# Patient Record
Sex: Male | Born: 1960 | Race: White | Hispanic: No | Marital: Married | Smoking: Current every day smoker
Health system: Southern US, Community
[De-identification: ages and names within clinical notes are randomized; demographics above are authoritative.]

## PROBLEM LIST (undated history)

## (undated) DIAGNOSIS — M542 Cervicalgia: Secondary | ICD-10-CM

## (undated) DIAGNOSIS — G8929 Other chronic pain: Secondary | ICD-10-CM

---

## 2003-07-24 ENCOUNTER — Ambulatory Visit (HOSPITAL_COMMUNITY): Admission: RE | Admit: 2003-07-24 | Discharge: 2003-07-24 | Payer: Self-pay | Admitting: Neurology

## 2003-12-19 ENCOUNTER — Ambulatory Visit (HOSPITAL_COMMUNITY): Admission: RE | Admit: 2003-12-19 | Discharge: 2003-12-19 | Payer: Self-pay | Admitting: Internal Medicine

## 2005-02-01 ENCOUNTER — Emergency Department (HOSPITAL_COMMUNITY): Admission: EM | Admit: 2005-02-01 | Discharge: 2005-02-01 | Payer: Self-pay | Admitting: Emergency Medicine

## 2005-02-11 ENCOUNTER — Emergency Department (HOSPITAL_COMMUNITY): Admission: EM | Admit: 2005-02-11 | Discharge: 2005-02-11 | Payer: Self-pay | Admitting: Emergency Medicine

## 2005-02-11 ENCOUNTER — Ambulatory Visit: Payer: Self-pay | Admitting: Psychiatry

## 2005-02-11 ENCOUNTER — Inpatient Hospital Stay (HOSPITAL_COMMUNITY): Admission: RE | Admit: 2005-02-11 | Discharge: 2005-02-15 | Payer: Self-pay | Admitting: Psychiatry

## 2005-03-14 ENCOUNTER — Ambulatory Visit: Payer: Self-pay | Admitting: Psychology

## 2005-04-29 ENCOUNTER — Ambulatory Visit: Payer: Self-pay | Admitting: Psychiatry

## 2005-05-03 ENCOUNTER — Emergency Department (HOSPITAL_COMMUNITY): Admission: EM | Admit: 2005-05-03 | Discharge: 2005-05-03 | Payer: Self-pay | Admitting: Emergency Medicine

## 2005-10-14 ENCOUNTER — Ambulatory Visit (HOSPITAL_COMMUNITY): Admission: RE | Admit: 2005-10-14 | Discharge: 2005-10-14 | Payer: Self-pay | Admitting: Family Medicine

## 2006-08-19 ENCOUNTER — Ambulatory Visit (HOSPITAL_COMMUNITY): Admission: RE | Admit: 2006-08-19 | Discharge: 2006-08-19 | Payer: Self-pay | Admitting: Internal Medicine

## 2010-08-07 ENCOUNTER — Emergency Department (HOSPITAL_COMMUNITY)
Admission: EM | Admit: 2010-08-07 | Discharge: 2010-08-08 | Disposition: A | Discharge status: Home / Self Care | Payer: Self-pay | Attending: Emergency Medicine | Admitting: Emergency Medicine

## 2010-08-07 DIAGNOSIS — K029 Dental caries, unspecified: Secondary | ICD-10-CM | POA: Insufficient documentation

## 2010-08-07 DIAGNOSIS — K089 Disorder of teeth and supporting structures, unspecified: Secondary | ICD-10-CM | POA: Insufficient documentation

## 2011-01-10 ENCOUNTER — Emergency Department (HOSPITAL_COMMUNITY)
Admission: EM | Admit: 2011-01-10 | Discharge: 2011-01-11 | Disposition: A | Discharge status: Home / Self Care | Payer: Medicaid Other | Attending: Emergency Medicine | Admitting: Emergency Medicine

## 2011-01-10 DIAGNOSIS — K089 Disorder of teeth and supporting structures, unspecified: Secondary | ICD-10-CM | POA: Insufficient documentation

## 2012-08-06 ENCOUNTER — Emergency Department (HOSPITAL_COMMUNITY)
Admission: EM | Admit: 2012-08-06 | Discharge: 2012-08-06 | Disposition: A | Discharge status: Home / Self Care | Payer: Self-pay | Attending: Emergency Medicine | Admitting: Emergency Medicine

## 2012-08-06 ENCOUNTER — Encounter (HOSPITAL_COMMUNITY): Payer: Self-pay

## 2012-08-06 ENCOUNTER — Emergency Department (HOSPITAL_COMMUNITY): Payer: Self-pay

## 2012-08-06 DIAGNOSIS — R071 Chest pain on breathing: Secondary | ICD-10-CM | POA: Insufficient documentation

## 2012-08-06 DIAGNOSIS — F17200 Nicotine dependence, unspecified, uncomplicated: Secondary | ICD-10-CM | POA: Insufficient documentation

## 2012-08-06 DIAGNOSIS — R079 Chest pain, unspecified: Secondary | ICD-10-CM

## 2012-08-06 DIAGNOSIS — R0602 Shortness of breath: Secondary | ICD-10-CM | POA: Insufficient documentation

## 2012-08-06 LAB — CBC WITH DIFFERENTIAL/PLATELET
Basophils Absolute: 0.1 10*3/uL (ref 0.0–0.1)
Basophils Relative: 1 % (ref 0–1)
Eosinophils Absolute: 0.2 10*3/uL (ref 0.0–0.7)
Eosinophils Relative: 3 % (ref 0–5)
HCT: 43.4 % (ref 39.0–52.0)
Hemoglobin: 15 g/dL (ref 13.0–17.0)
Lymphocytes Relative: 39 % (ref 12–46)
Lymphs Abs: 3.1 10*3/uL (ref 0.7–4.0)
MCH: 32.1 pg (ref 26.0–34.0)
MCHC: 34.6 g/dL (ref 30.0–36.0)
MCV: 92.9 fL (ref 78.0–100.0)
Monocytes Absolute: 0.5 10*3/uL (ref 0.1–1.0)
Monocytes Relative: 6 % (ref 3–12)
Neutro Abs: 4 10*3/uL (ref 1.7–7.7)
Neutrophils Relative %: 51 % (ref 43–77)
Platelets: 283 10*3/uL (ref 150–400)
RBC: 4.67 MIL/uL (ref 4.22–5.81)
RDW: 14.3 % (ref 11.5–15.5)
WBC: 7.9 10*3/uL (ref 4.0–10.5)

## 2012-08-06 LAB — D-DIMER, QUANTITATIVE: D-Dimer, Quant: <0.27 ug/mL-FEU (ref 0.00–0.48)

## 2012-08-06 LAB — BASIC METABOLIC PANEL
BUN: 10 mg/dL (ref 6–23)
CO2: 26 mEq/L (ref 19–32)
Calcium: 9.4 mg/dL (ref 8.4–10.5)
Chloride: 102 mEq/L (ref 96–112)
Creatinine, Ser: 0.76 mg/dL (ref 0.50–1.35)
GFR calc Af Amer: >90 mL/min (ref >90)
GFR calc non Af Amer: >90 mL/min (ref >90)
Glucose, Bld: 117 mg/dL — ABNORMAL HIGH (ref 70–99)
Potassium: 3.6 mEq/L (ref 3.5–5.1)
Sodium: 138 mEq/L (ref 135–145)

## 2012-08-06 LAB — TROPONIN I
Troponin I: <0.3 ng/mL (ref <0.30)
Troponin I: <0.3 ng/mL (ref <0.30)

## 2012-08-06 MED ORDER — KETOROLAC TROMETHAMINE 30 MG/ML IJ SOLN
30.0000 mg | Freq: Once | INTRAMUSCULAR | Status: AC
  Administered 2012-08-06: 30 mg via INTRAVENOUS
  Filled 2012-08-06: qty 1

## 2012-08-06 MED ORDER — CYCLOBENZAPRINE HCL 10 MG PO TABS
10.0000 mg | ORAL_TABLET | Freq: Once | ORAL | Status: AC
  Administered 2012-08-06: 10 mg via ORAL
  Filled 2012-08-06: qty 1

## 2012-08-06 MED ORDER — SODIUM CHLORIDE 0.9 % IV SOLN
Freq: Once | INTRAVENOUS | Status: AC
  Administered 2012-08-06: 10:00:00 via INTRAVENOUS

## 2012-08-06 MED ORDER — ACETAMINOPHEN 500 MG PO TABS
1000.0000 mg | ORAL_TABLET | Freq: Once | ORAL | Status: AC
  Administered 2012-08-06: 1000 mg via ORAL
  Filled 2012-08-06: qty 2

## 2012-08-06 MED ORDER — NITROGLYCERIN 0.4 MG SL SUBL
0.4000 mg | SUBLINGUAL_TABLET | Freq: Once | SUBLINGUAL | Status: AC
  Administered 2012-08-06: 0.4 mg via SUBLINGUAL
  Filled 2012-08-06: qty 25

## 2012-08-06 MED ORDER — CYCLOBENZAPRINE HCL 5 MG PO TABS: 5.0000 mg | ORAL_TABLET | Freq: Three times a day (TID) | ORAL | Status: DC | PRN

## 2012-08-06 NOTE — ED Provider Notes (Signed)
History   This chart was scribed for Ward Givens, MD by Charolett Bumpers, ED Scribe. The patient was seen in room APA07/APA07. Patient's care was started at 0918.   CSN: [id number]  Arrival date & time 08/06/12  5284   First MD Initiated Contact with Patient 08/06/12 [phone]      Chief Complaint  Patient presents with  . Chest Pain    The history is provided by the patient. No language interpreter was used.   Jon Campbell is a 52 y.o. male who presents to the Emergency Department complaining of left-sided, non-radiating, chest pain that started 45 minutes ago while making coffee. He describes the pain as sharp and constant. He rates his pain 8/10 currently and 10/10 at it's worse. He took 4 regular aspiring PTA with mild relief. He reports a h/o intermittent similar chest pain over the past year, lasting seconds at a time. He reports associated SOB and states his chest pain is aggravated with deep breaths. He denies any nausea, vomiting, diaphoresis, radiation of pain. He denies any recent increases in stress or activity. He states that he is otherwise healthy and denies any regular medications. He reports a family h/o HTN but denies any MI, stents, bypass surgery.   No PCP.   History reviewed. No pertinent past medical history.  History reviewed. No pertinent past surgical history.  No family history on file. MOP, FOP have HTN No CAD  History  Substance Use Topics  . Smoking status: Current Every Day Smoker  . Smokeless tobacco: Not on file  . Alcohol Use: No  He admits to smoking a pack daily. No alcohol use. He is self-employed, works in home improvement.   Review of Systems  Respiratory: Positive for shortness of breath. Negative for cough.   Cardiovascular: Positive for chest pain. Negative for leg swelling.  All other systems reviewed and are negative.    Allergies  Review of patient's allergies indicates no known allergies.  Home Medications   Current  Outpatient Rx  Name  Route  Sig  Dispense  Refill  . ACETAMINOPHEN 500 MG PO TABS   Oral   Take 1,000 mg by mouth every 6 (six) hours as needed. Pain         . IBUPROFEN 200 MG PO TABS   Oral   Take 400 mg by mouth every 6 (six) hours as needed. Pain         . CYCLOBENZAPRINE HCL 5 MG PO TABS   Oral   Take 1 tablet (5 mg total) by mouth 3 (three) times daily as needed for muscle spasms.   30 tablet   0     Triage Vitals: BP 129/89  Pulse 77  Resp 20  Ht 5\' 10"  (1.778 m)  Wt 155 lb (70.308 kg)  BMI 22.24 kg/m2  SpO2 96%  Vital signs normal    Physical Exam  Nursing note and vitals reviewed. Constitutional: He is oriented to person, place, and time. He appears well-developed and well-nourished. No distress.  HENT:  Head: Normocephalic and atraumatic.  Right Ear: External ear normal.  Left Ear: External ear normal.  Nose: Nose normal.  Mouth/Throat: Oropharynx is clear and moist. No oropharyngeal exudate.  Eyes: Conjunctivae normal and EOM are normal. Pupils are equal, round, and reactive to light.  Neck: Normal range of motion. Neck supple. No tracheal deviation present. No thyromegaly present.  Cardiovascular: Normal rate, regular rhythm and normal heart sounds.  Exam reveals no gallop  and no friction rub.   No murmur heard. Pulmonary/Chest: Effort normal. No respiratory distress. He has wheezes. He has no rhonchi. He has no rales. He exhibits no tenderness.         Scattered wheezes noted. Pt reports left sided chest wall tenderness earlier, but not on exam.  Area of pain noted.   Abdominal: Soft. Bowel sounds are normal. He exhibits no distension. There is no tenderness. There is no rebound and no guarding.  Musculoskeletal: Normal range of motion. He exhibits no edema and no tenderness.  Neurological: He is alert and oriented to person, place, and time.  Skin: Skin is warm and dry.  Psychiatric: His behavior is normal. His mood appears anxious.    ED Course   Procedures (including critical care time)   Medications  ketorolac (TORADOL) 30 MG/ML injection 30 mg (30 mg Intravenous Given 08/06/12 0942)  nitroGLYCERIN (NITROSTAT) SL tablet 0.4 mg (0.4 mg Sublingual Given 08/06/12 0943)  cyclobenzaprine (FLEXERIL) tablet 10 mg (10 mg Oral Given 08/06/12 0942)  0.9 %  sodium chloride infusion (  Intravenous New Bag/Given 08/06/12 0943)  acetaminophen (TYLENOL) tablet 1,000 mg (1000 mg Oral Given 08/06/12 1016)     DIAGNOSTIC STUDIES: Oxygen Saturation is 96% on room air, adequate by my interpretation.    COORDINATION OF CARE:  09:30-Informed pt of normal EKG. Discussed planned course of treatment with the patient including a chest x-ray and blood work, who is agreeable at this time.   11:06-Recheck: Pt feels improved after ED medications. Informed him of lab and imaging results. Will recheck Troponin prior to d/c. Pt is agreeable with plan.   12:22-Recheck: Second troponin is negative. Will d/c home with muscle relaxer. Advised to f/u with cardiologist for stress test because of his smoking history.    Results for orders placed during the hospital encounter of 08/06/12  CBC WITH DIFFERENTIAL      Component Value Range   WBC 7.9  4.0 - 10.5 K/uL   RBC 4.67  4.22 - 5.81 MIL/uL   Hemoglobin 15.0  13.0 - 17.0 g/dL   HCT 62.1  30.8 - 65.7 %   MCV 92.9  78.0 - 100.0 fL   MCH 32.1  26.0 - 34.0 pg   MCHC 34.6  30.0 - 36.0 g/dL   RDW 84.6  96.2 - 95.2 %   Platelets 283  150 - 400 K/uL   Neutrophils Relative 51  43 - 77 %   Neutro Abs 4.0  1.7 - 7.7 K/uL   Lymphocytes Relative 39  12 - 46 %   Lymphs Abs 3.1  0.7 - 4.0 K/uL   Monocytes Relative 6  3 - 12 %   Monocytes Absolute 0.5  0.1 - 1.0 K/uL   Eosinophils Relative 3  0 - 5 %   Eosinophils Absolute 0.2  0.0 - 0.7 K/uL   Basophils Relative 1  0 - 1 %   Basophils Absolute 0.1  0.0 - 0.1 K/uL  BASIC METABOLIC PANEL      Component Value Range   Sodium 138  135 - 145 mEq/L   Potassium 3.6  3.5 -  5.1 mEq/L   Chloride 102  96 - 112 mEq/L   CO2 26  19 - 32 mEq/L   Glucose, Bld 117 (*) 70 - 99 mg/dL   BUN 10  6 - 23 mg/dL   Creatinine, Ser 8.41  0.50 - 1.35 mg/dL   Calcium 9.4  8.4 - 32.4 mg/dL  GFR calc non Af Amer >90  >90 mL/min   GFR calc Af Amer >90  >90 mL/min  TROPONIN I      Component Value Range   Troponin I <0.30  <0.30 ng/mL  D-DIMER, QUANTITATIVE      Component Value Range   D-Dimer, Quant <0.27  0.00 - 0.48 ug/mL-FEU  TROPONIN I      Component Value Range   Troponin I <0.30  <0.30 ng/mL   Laboratory interpretation all normal   Dg Chest Portable 1 View  08/06/2012  *RADIOLOGY REPORT*  Clinical Data: Chest pain  PORTABLE CHEST - 1 VIEW  Comparison: 02/01/2005  Findings: Lungs remain hyperaerated and clear.  Normal heart size. No pneumothorax.  No obvious rib fracture.  IMPRESSION: No active cardiopulmonary disease.  Hyperaeration.   Original Report Authenticated By: Jolaine Click, M.D.     Date: 08/06/2012  Rate: 75  Rhythm: normal sinus rhythm  QRS Axis: normal  Intervals: normal  ST/T Wave abnormalities: normal  Conduction Disutrbances:none  Narrative Interpretation:   Old EKG Reviewed: none available    1. Chest pain    New Prescriptions   CYCLOBENZAPRINE (FLEXERIL) 5 MG TABLET    Take 1 tablet (5 mg total) by mouth 3 (three) times daily as needed for muscle spasms.    Plan discharge  Devoria Albe, MD, FACEP    MDM   I personally performed the services described in this documentation, which was scribed in my presence. The recorded information has been reviewed and considered.  Devoria Albe, MD, Armando Gang     Ward Givens, MD 08/06/12 [phone]

## 2012-08-06 NOTE — ED Notes (Signed)
Pt says chest pain feels like a vague soreness at this time but c/o headache.  Dr. Lynelle Doctor notified and tylenol given.

## 2012-08-06 NOTE — ED Notes (Signed)
Pt reports woke up this morning around 0715 and approx ago started having pain in left chest.  Denies pain radiating anywhere else.  Denies n/v or SOB.  Says hurts to take a deep breath but is not sore to touch.  Pt took 4 aspirin prior to arrival.

## 2013-03-23 ENCOUNTER — Emergency Department (HOSPITAL_COMMUNITY)
Admission: EM | Admit: 2013-03-23 | Discharge: 2013-03-23 | Disposition: A | Discharge status: Home / Self Care | Payer: 59 | Attending: Emergency Medicine | Admitting: Emergency Medicine

## 2013-03-23 ENCOUNTER — Encounter (HOSPITAL_COMMUNITY): Payer: Self-pay | Admitting: *Deleted

## 2013-03-23 ENCOUNTER — Emergency Department (HOSPITAL_COMMUNITY): Payer: 59

## 2013-03-23 DIAGNOSIS — R197 Diarrhea, unspecified: Secondary | ICD-10-CM | POA: Insufficient documentation

## 2013-03-23 DIAGNOSIS — F17200 Nicotine dependence, unspecified, uncomplicated: Secondary | ICD-10-CM | POA: Insufficient documentation

## 2013-03-23 DIAGNOSIS — R112 Nausea with vomiting, unspecified: Secondary | ICD-10-CM

## 2013-03-23 DIAGNOSIS — E876 Hypokalemia: Secondary | ICD-10-CM | POA: Insufficient documentation

## 2013-03-23 LAB — COMPREHENSIVE METABOLIC PANEL
ALT: 14 U/L (ref 0–53)
AST: 24 U/L (ref 0–37)
Albumin: 4 g/dL (ref 3.5–5.2)
Alkaline Phosphatase: 59 U/L (ref 39–117)
BUN: 12 mg/dL (ref 6–23)
CO2: 27 mEq/L (ref 19–32)
Calcium: 9.8 mg/dL (ref 8.4–10.5)
Chloride: 94 mEq/L — ABNORMAL LOW (ref 96–112)
Creatinine, Ser: 0.7 mg/dL (ref 0.50–1.35)
GFR calc Af Amer: >90 mL/min (ref >90)
GFR calc non Af Amer: >90 mL/min (ref >90)
Glucose, Bld: 105 mg/dL — ABNORMAL HIGH (ref 70–99)
Potassium: 2.9 mEq/L — ABNORMAL LOW (ref 3.5–5.1)
Sodium: 133 mEq/L — ABNORMAL LOW (ref 135–145)
Total Bilirubin: 0.4 mg/dL (ref 0.3–1.2)
Total Protein: 7.6 g/dL (ref 6.0–8.3)

## 2013-03-23 LAB — URINE MICROSCOPIC-ADD ON

## 2013-03-23 LAB — CBC
HCT: 43.8 % (ref 39.0–52.0)
Hemoglobin: 15.5 g/dL (ref 13.0–17.0)
MCH: 31.7 pg (ref 26.0–34.0)
MCHC: 35.4 g/dL (ref 30.0–36.0)
MCV: 89.6 fL (ref 78.0–100.0)
Platelets: 425 10*3/uL — ABNORMAL HIGH (ref 150–400)
RBC: 4.89 MIL/uL (ref 4.22–5.81)
RDW: 13.2 % (ref 11.5–15.5)
WBC: 10.6 10*3/uL — ABNORMAL HIGH (ref 4.0–10.5)

## 2013-03-23 LAB — URINALYSIS, ROUTINE W REFLEX MICROSCOPIC
Bilirubin Urine: NEGATIVE
Glucose, UA: NEGATIVE mg/dL
Ketones, ur: NEGATIVE mg/dL
Leukocytes, UA: NEGATIVE
Nitrite: NEGATIVE
Protein, ur: NEGATIVE mg/dL
Specific Gravity, Urine: 1.015 (ref 1.005–1.030)
Urobilinogen, UA: 0.2 mg/dL (ref 0.0–1.0)
pH: 8.5 — ABNORMAL HIGH (ref 5.0–8.0)

## 2013-03-23 LAB — TROPONIN I: Troponin I: <0.3 ng/mL (ref <0.30)

## 2013-03-23 LAB — LIPASE, BLOOD: Lipase: 30 U/L (ref 11–59)

## 2013-03-23 MED ORDER — POTASSIUM CHLORIDE 20 MEQ/15ML (10%) PO LIQD
40.0000 meq | Freq: Once | ORAL | Status: AC
  Administered 2013-03-23: 40 meq via ORAL
  Filled 2013-03-23: qty 30

## 2013-03-23 MED ORDER — ONDANSETRON HCL 4 MG/2ML IJ SOLN
4.0000 mg | INTRAMUSCULAR | Status: DC | PRN
  Administered 2013-03-23: 4 mg via INTRAVENOUS
  Filled 2013-03-23: qty 2

## 2013-03-23 MED ORDER — ONDANSETRON HCL 4 MG PO TABS: 4.0000 mg | ORAL_TABLET | Freq: Three times a day (TID) | ORAL | Status: DC | PRN

## 2013-03-23 MED ORDER — POTASSIUM CHLORIDE 10 MEQ/100ML IV SOLN
10.0000 meq | Freq: Once | INTRAVENOUS | Status: AC
  Administered 2013-03-23: 10 meq via INTRAVENOUS
  Filled 2013-03-23: qty 100

## 2013-03-23 MED ORDER — FAMOTIDINE IN NACL 20-0.9 MG/50ML-% IV SOLN
20.0000 mg | Freq: Once | INTRAVENOUS | Status: AC
  Administered 2013-03-23: 20 mg via INTRAVENOUS
  Filled 2013-03-23: qty 50

## 2013-03-23 MED ORDER — GI COCKTAIL ~~LOC~~
30.0000 mL | Freq: Once | ORAL | Status: AC
  Administered 2013-03-23: 30 mL via ORAL
  Filled 2013-03-23: qty 30

## 2013-03-23 MED ORDER — SODIUM CHLORIDE 0.9 % IV SOLN
INTRAVENOUS | Status: DC
  Administered 2013-03-23: 15:00:00 via INTRAVENOUS

## 2013-03-23 NOTE — ED Provider Notes (Signed)
CSN: [id number]     Arrival date & time 03/23/13  1232 History   First MD Initiated Contact with Patient 03/23/13 1346     Chief Complaint  Patient presents with  . Emesis    HPI Pt was seen at 1350. Per pt, c/o gradual onset and persistence of multiple intermittent episodes of N/V/D that began 2-3 days ago.   Describes the stools as "watery." Has been associated with constant "burning" chest pain that began yesterday after vomiting. Denies abd pain, no CP/SOB, no back pain, no fevers, no black or blood in stools or emesis.    History reviewed. No pertinent past medical history.  History reviewed. No pertinent past surgical history.  No FHx premature CAD Family History  Problem Relation Age of Onset  . Hypertension Mother   . Hypertension Father     History  Substance Use Topics  . Smoking status: Current Every Day Smoker  . Smokeless tobacco: Not on file  . Alcohol Use: No    Review of Systems ROS: Statement: All systems negative except as marked or noted in the HPI; Constitutional: Negative for fever and chills. ; ; Eyes: Negative for eye pain, redness and discharge. ; ; ENMT: Negative for ear pain, hoarseness, nasal congestion, sinus pressure and sore throat. ; ; Cardiovascular: +CP. Negative for palpitations, diaphoresis, dyspnea and peripheral edema. ; ; Respiratory: Negative for cough, wheezing and stridor. ; ; Gastrointestinal: +N/V/D. Negative for abdominal pain, blood in stool, hematemesis, jaundice and rectal bleeding. . ; ; Genitourinary: Negative for dysuria, flank pain and hematuria. ; ; Musculoskeletal: Negative for back pain and neck pain. Negative for swelling and trauma.; ; Skin: Negative for pruritus, rash, abrasions, blisters, bruising and skin lesion.; ; Neuro: Negative for headache, lightheadedness and neck stiffness. Negative for weakness, altered level of consciousness , altered mental status, extremity weakness, paresthesias, involuntary movement, seizure and  syncope.       Allergies  Review of patient's allergies indicates no known allergies.  Home Medications   Current Outpatient Rx  Name  Route  Sig  Dispense  Refill  . ibuprofen (ADVIL,MOTRIN) 200 MG tablet   Oral   Take 400 mg by mouth every 6 (six) hours as needed. Pain          BP 120/85  Pulse 73  Temp(Src) 98.6 F (37 C) (Oral)  Resp 16  Ht 5\' 10"  (1.778 m)  Wt 155 lb (70.308 kg)  BMI 22.24 kg/m2  SpO2 100% Physical Exam 1355: Physical examination:  Nursing notes reviewed; Vital signs and O2 SAT reviewed;  Constitutional: Well developed, Well nourished, Well hydrated, In no acute distress; Head:  Normocephalic, atraumatic; Eyes: EOMI, PERRL, No scleral icterus; ENMT: Mouth and pharynx normal, Mucous membranes moist; Neck: Supple, Full range of motion, No lymphadenopathy; Cardiovascular: Regular rate and rhythm, No murmur, rub, or gallop; Respiratory: Breath sounds clear & equal bilaterally, No rales, rhonchi, wheezes.  Speaking full sentences with ease, Normal respiratory effort/excursion; Chest: Nontender, Movement normal; Abdomen: Soft, Nontender, Nondistended, Normal bowel sounds; Genitourinary: No CVA tenderness; Extremities: Pulses normal, No tenderness, No edema, No calf edema or asymmetry.; Neuro: AA&Ox3, Major CN grossly intact.  Speech clear. Climbs on and off stretcher easily by himself. Gait steady. No gross focal motor or sensory deficits in extremities.; Skin: Color normal, Warm, Dry.   ED Course  Procedures     MDM  MDM Reviewed: previous chart, nursing note and vitals Reviewed previous: labs and ECG Interpretation: labs, ECG and x-ray  Date: 03/23/2013  Rate: 66  Rhythm: normal sinus rhythm  QRS Axis: normal  Intervals: normal  ST/T Wave abnormalities: normal  Conduction Disutrbances:none  Narrative Interpretation:   Old EKG Reviewed: unchanged; no significant changes from previous EKG dated 08/06/2012.  Results for orders placed during  the hospital encounter of 03/23/13  CBC      Result Value Range   WBC 10.6 (*) 4.0 - 10.5 K/uL   RBC 4.89  4.22 - 5.81 MIL/uL   Hemoglobin 15.5  13.0 - 17.0 g/dL   HCT 16.1  09.6 - 04.5 %   MCV 89.6  78.0 - 100.0 fL   MCH 31.7  26.0 - 34.0 pg   MCHC 35.4  30.0 - 36.0 g/dL   RDW 40.9  81.1 - 91.4 %   Platelets 425 (*) 150 - 400 K/uL  COMPREHENSIVE METABOLIC PANEL      Result Value Range   Sodium 133 (*) 135 - 145 mEq/L   Potassium 2.9 (*) 3.5 - 5.1 mEq/L   Chloride 94 (*) 96 - 112 mEq/L   CO2 27  19 - 32 mEq/L   Glucose, Bld 105 (*) 70 - 99 mg/dL   BUN 12  6 - 23 mg/dL   Creatinine, Ser 7.82  0.50 - 1.35 mg/dL   Calcium 9.8  8.4 - 95.6 mg/dL   Total Protein 7.6  6.0 - 8.3 g/dL   Albumin 4.0  3.5 - 5.2 g/dL   AST 24  0 - 37 U/L   ALT 14  0 - 53 U/L   Alkaline Phosphatase 59  39 - 117 U/L   Total Bilirubin 0.4  0.3 - 1.2 mg/dL   GFR calc non Af Amer >90  >90 mL/min   GFR calc Af Amer >90  >90 mL/min  URINALYSIS, ROUTINE W REFLEX MICROSCOPIC      Result Value Range   Color, Urine YELLOW  YELLOW   APPearance HAZY (*) CLEAR   Specific Gravity, Urine 1.015  1.005 - 1.030   pH 8.5 (*) 5.0 - 8.0   Glucose, UA NEGATIVE  NEGATIVE mg/dL   Hgb urine dipstick SMALL (*) NEGATIVE   Bilirubin Urine NEGATIVE  NEGATIVE   Ketones, ur NEGATIVE  NEGATIVE mg/dL   Protein, ur NEGATIVE  NEGATIVE mg/dL   Urobilinogen, UA 0.2  0.0 - 1.0 mg/dL   Nitrite NEGATIVE  NEGATIVE   Leukocytes, UA NEGATIVE  NEGATIVE  LIPASE, BLOOD      Result Value Range   Lipase 30  11 - 59 U/L  TROPONIN I      Result Value Range   Troponin I <0.30  <0.30 ng/mL  URINE MICROSCOPIC-ADD ON      Result Value Range   WBC, UA 0-2  <3 WBC/hpf   RBC / HPF 0-2  <3 RBC/hpf   Dg Abd Acute W/chest 03/23/2013   CLINICAL DATA:  Vomiting.  EXAM: ACUTE ABDOMEN SERIES (ABDOMEN 2 VIEW & CHEST 1 VIEW)  COMPARISON:  08/06/2012 CHEST x-ray.  FINDINGS: Small calcifications in the pelvis, felt to represent phleboliths. The bowel gas  pattern is normal. There is no evidence of free intraperitoneal air. No suspicious radio-opaque calculi or other significant radiographic abnormality is seen. Heart size and mediastinal contours are within normal limits. Both lungs are clear.  IMPRESSION: Negative abdominal radiographs.  No acute cardiopulmonary disease.   Electronically Signed   By: Charlett Nose M.D.   On: 03/23/2013 14:29    1540:  Potassium repleted PO and  IV. Pt has tol PO well while in the ED without N/V.  No stooling while in the ED.  Abd remains benign, VSS. Doubt PE as cause for symptoms with low risk Wells.  Doubt ACS as cause for symptoms with normal troponin and unchanged EKG from previous after 2 days of constant symptoms.  Pt states he "completely feels better" after meds and wants to go home now. Dx and testing d/w pt and family.  Questions answered.  Verb understanding, agreeable to d/c home with outpt f/u.     Laray Anger, DO 03/25/13 1242

## 2013-03-23 NOTE — ED Notes (Signed)
Pt's wife to triage, requested a bed for pt to lay down on, advised that no beds available at this time.  Nodded in understanding.

## 2013-03-23 NOTE — ED Notes (Signed)
Pt states vomiting began Monday night. Unable to keep anything down. Small amount of diarrhea. Pt also states burning sensation to chest which began yesterday stating, "from throwing up so much"

## 2013-03-23 NOTE — ED Notes (Signed)
Pt's wife to desk, requested socks for pt. Given pair of safety socks

## 2014-03-08 ENCOUNTER — Ambulatory Visit (HOSPITAL_COMMUNITY)
Admission: RE | Admit: 2014-03-08 | Discharge: 2014-03-08 | Disposition: A | Discharge status: Home / Self Care | Payer: 59 | Source: Ambulatory Visit | Attending: Physician Assistant | Admitting: Physician Assistant

## 2014-03-08 ENCOUNTER — Other Ambulatory Visit (HOSPITAL_COMMUNITY): Payer: Self-pay | Admitting: Physician Assistant

## 2014-03-08 ENCOUNTER — Encounter (INDEPENDENT_AMBULATORY_CARE_PROVIDER_SITE_OTHER): Payer: Self-pay

## 2014-03-08 DIAGNOSIS — M542 Cervicalgia: Secondary | ICD-10-CM

## 2014-03-08 DIAGNOSIS — M5030 Other cervical disc degeneration, unspecified cervical region: Secondary | ICD-10-CM | POA: Diagnosis not present

## 2014-03-10 ENCOUNTER — Ambulatory Visit (HOSPITAL_COMMUNITY)
Admission: RE | Admit: 2014-03-10 | Discharge: 2014-03-10 | Disposition: A | Discharge status: Home / Self Care | Payer: 59 | Source: Ambulatory Visit | Attending: Physician Assistant | Admitting: Physician Assistant

## 2014-03-10 DIAGNOSIS — M4802 Spinal stenosis, cervical region: Secondary | ICD-10-CM | POA: Diagnosis not present

## 2014-03-10 DIAGNOSIS — M5030 Other cervical disc degeneration, unspecified cervical region: Secondary | ICD-10-CM | POA: Insufficient documentation

## 2014-03-10 DIAGNOSIS — M79609 Pain in unspecified limb: Secondary | ICD-10-CM | POA: Diagnosis not present

## 2014-03-10 DIAGNOSIS — M436 Torticollis: Secondary | ICD-10-CM | POA: Diagnosis not present

## 2014-03-10 DIAGNOSIS — R209 Unspecified disturbances of skin sensation: Secondary | ICD-10-CM | POA: Diagnosis not present

## 2014-03-10 DIAGNOSIS — M542 Cervicalgia: Secondary | ICD-10-CM | POA: Diagnosis present

## 2014-03-10 DIAGNOSIS — M4000 Postural kyphosis, site unspecified: Secondary | ICD-10-CM | POA: Diagnosis not present

## 2014-03-10 DIAGNOSIS — M5020 Other cervical disc displacement, unspecified cervical region: Secondary | ICD-10-CM | POA: Diagnosis not present

## 2014-04-09 ENCOUNTER — Emergency Department (HOSPITAL_COMMUNITY)
Admission: EM | Admit: 2014-04-09 | Discharge: 2014-04-09 | Disposition: A | Discharge status: Home / Self Care | Payer: 59 | Attending: Emergency Medicine | Admitting: Emergency Medicine

## 2014-04-09 ENCOUNTER — Encounter (HOSPITAL_COMMUNITY): Payer: Self-pay | Admitting: Emergency Medicine

## 2014-04-09 ENCOUNTER — Emergency Department (HOSPITAL_COMMUNITY): Payer: 59

## 2014-04-09 DIAGNOSIS — S5011XA Contusion of right forearm, initial encounter: Secondary | ICD-10-CM

## 2014-04-09 DIAGNOSIS — Z720 Tobacco use: Secondary | ICD-10-CM | POA: Diagnosis not present

## 2014-04-09 DIAGNOSIS — Y92410 Unspecified street and highway as the place of occurrence of the external cause: Secondary | ICD-10-CM | POA: Diagnosis not present

## 2014-04-09 DIAGNOSIS — M79621 Pain in right upper arm: Secondary | ICD-10-CM

## 2014-04-09 DIAGNOSIS — Y9389 Activity, other specified: Secondary | ICD-10-CM | POA: Insufficient documentation

## 2014-04-09 DIAGNOSIS — T148XXA Other injury of unspecified body region, initial encounter: Secondary | ICD-10-CM

## 2014-04-09 DIAGNOSIS — M898X2 Other specified disorders of bone, upper arm: Secondary | ICD-10-CM

## 2014-04-09 DIAGNOSIS — S40911A Unspecified superficial injury of right shoulder, initial encounter: Secondary | ICD-10-CM | POA: Diagnosis present

## 2014-04-09 MED ORDER — NAPROXEN 500 MG PO TABS: 500.0000 mg | ORAL_TABLET | Freq: Two times a day (BID) | ORAL | Status: DC | PRN

## 2014-04-09 MED ORDER — IBUPROFEN 800 MG PO TABS
800.0000 mg | ORAL_TABLET | Freq: Once | ORAL | Status: AC
  Administered 2014-04-09: 800 mg via ORAL
  Filled 2014-04-09: qty 1

## 2014-04-09 MED ORDER — MORPHINE SULFATE 4 MG/ML IJ SOLN
6.0000 mg | Freq: Once | INTRAMUSCULAR | Status: AC
  Administered 2014-04-09: 6 mg via INTRAVENOUS
  Filled 2014-04-09: qty 2

## 2014-04-09 NOTE — Discharge Instructions (Signed)
Use ice and keep abrasions clean. If you were given medicines take as directed.  If you are on coumadin or contraceptives realize their levels and effectiveness is altered by many different medicines.  If you have any reaction (rash, tongues swelling, other) to the medicines stop taking and see a physician.   Please follow up as directed and return to the ER or see a physician for new or worsening symptoms.  Thank you. Filed Vitals:   04/09/14 1109 04/09/14 1130  BP:  121/67  Pulse: 88 81  Temp: 97.5 F (36.4 C)   TempSrc: Oral   Resp: 20   Height: 5\' 10"  (1.778 m)   Weight: 155 lb (70.308 kg)   SpO2: 99% 100%
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
## 2014-04-09 NOTE — ED Notes (Signed)
MD at bedside. 
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
## 2014-08-18 ENCOUNTER — Other Ambulatory Visit (HOSPITAL_COMMUNITY): Payer: Self-pay | Admitting: Orthopedic Surgery

## 2014-08-18 DIAGNOSIS — M542 Cervicalgia: Secondary | ICD-10-CM

## 2014-08-22 ENCOUNTER — Ambulatory Visit (HOSPITAL_COMMUNITY)
Admission: RE | Admit: 2014-08-22 | Discharge: 2014-08-22 | Disposition: A | Discharge status: Home / Self Care | Payer: 59 | Source: Ambulatory Visit | Attending: Orthopedic Surgery | Admitting: Orthopedic Surgery

## 2014-08-22 DIAGNOSIS — G8929 Other chronic pain: Secondary | ICD-10-CM | POA: Diagnosis not present

## 2014-08-22 DIAGNOSIS — M542 Cervicalgia: Secondary | ICD-10-CM | POA: Diagnosis not present

## 2014-08-22 DIAGNOSIS — R200 Anesthesia of skin: Secondary | ICD-10-CM | POA: Diagnosis not present

## 2016-05-13 DIAGNOSIS — F419 Anxiety disorder, unspecified: Secondary | ICD-10-CM | POA: Diagnosis not present

## 2016-05-13 DIAGNOSIS — Z6822 Body mass index (BMI) 22.0-22.9, adult: Secondary | ICD-10-CM | POA: Diagnosis not present

## 2016-05-13 DIAGNOSIS — Z1389 Encounter for screening for other disorder: Secondary | ICD-10-CM | POA: Diagnosis not present

## 2016-05-13 DIAGNOSIS — Z23 Encounter for immunization: Secondary | ICD-10-CM | POA: Diagnosis not present

## 2016-05-13 DIAGNOSIS — M47812 Spondylosis without myelopathy or radiculopathy, cervical region: Secondary | ICD-10-CM | POA: Diagnosis not present

## 2016-05-13 DIAGNOSIS — M5030 Other cervical disc degeneration, unspecified cervical region: Secondary | ICD-10-CM | POA: Diagnosis not present

## 2016-08-04 DIAGNOSIS — G894 Chronic pain syndrome: Secondary | ICD-10-CM | POA: Diagnosis not present

## 2016-08-04 DIAGNOSIS — F419 Anxiety disorder, unspecified: Secondary | ICD-10-CM | POA: Diagnosis not present

## 2016-08-04 DIAGNOSIS — Z6822 Body mass index (BMI) 22.0-22.9, adult: Secondary | ICD-10-CM | POA: Diagnosis not present

## 2016-08-04 DIAGNOSIS — Z1389 Encounter for screening for other disorder: Secondary | ICD-10-CM | POA: Diagnosis not present

## 2016-08-07 ENCOUNTER — Encounter (INDEPENDENT_AMBULATORY_CARE_PROVIDER_SITE_OTHER): Payer: Self-pay | Admitting: *Deleted

## 2016-08-18 ENCOUNTER — Encounter (INDEPENDENT_AMBULATORY_CARE_PROVIDER_SITE_OTHER): Payer: Self-pay | Admitting: *Deleted

## 2016-08-18 ENCOUNTER — Other Ambulatory Visit (INDEPENDENT_AMBULATORY_CARE_PROVIDER_SITE_OTHER): Payer: Self-pay | Admitting: *Deleted

## 2016-08-18 ENCOUNTER — Telehealth (INDEPENDENT_AMBULATORY_CARE_PROVIDER_SITE_OTHER): Payer: Self-pay | Admitting: *Deleted

## 2016-08-18 DIAGNOSIS — Z1211 Encounter for screening for malignant neoplasm of colon: Secondary | ICD-10-CM | POA: Insufficient documentation

## 2016-08-18 MED ORDER — PEG 3350-KCL-NA BICARB-NACL 420 G PO SOLR: 4000.0000 mL | Freq: Once | ORAL | 0 refills | Status: AC

## 2016-08-18 NOTE — Telephone Encounter (Signed)
Patient needs trilyte 

## 2016-08-22 ENCOUNTER — Telehealth (INDEPENDENT_AMBULATORY_CARE_PROVIDER_SITE_OTHER): Payer: Self-pay | Admitting: *Deleted

## 2016-08-22 NOTE — Telephone Encounter (Signed)
Referring MD/PCP: fusco   Procedure: tcs w propofol  Reason/Indication:  screening  Has patient had this procedure before?  no  If so, when, by whom and where?    Is there a family history of colon cancer?  no  Who?  What age when diagnosed?    Is patient diabetic?   no      Does patient have prosthetic heart valve or mechanical valve?  no  Do you have a pacemaker?  no  Has patient ever had endocarditis? no  Has patient had joint replacement within last 12 months?  no  Does patient tend to be constipated or take laxatives? no  Does patient have a history of alcohol/drug use?  no  Is patient on Coumadin, Plavix and/or Aspirin? no  Medications: see epic  Allergies: nkda  Medication Adjustment:   Procedure date & time: 09/19/16 at 1030

## 2016-08-25 NOTE — Telephone Encounter (Signed)
agree

## 2016-09-11 NOTE — Patient Instructions (Signed)
Jon Campbell  09/11/2016     @[id number]$ @   Your procedure is scheduled on  09/19/2016   [facility] at  900 A.M.  Call this number if you have problems the morning of surgery:  [phone]   Remember:  Do not eat food or drink liquids after midnight.  Take these medicines the morning of surgery with A SIP OF WATER  Xanax, flexaril, oxycodone.   Do not wear jewelry, make-up or nail polish.  Do not wear lotions, powders, or perfumes, or deoderant.  Do not shave 48 hours prior to surgery.  Men may shave face and neck.  Do not bring valuables to the hospital.  [facility] not [facility] any belongings or valuables.  Contacts, dentures or bridgework may not be worn into surgery.  Leave your suitcase in the car.  After surgery it may be brought to your room.  For patients admitted to the hospital, discharge time will be determined by your treatment team.  Patients discharged the day of surgery will not be allowed to drive home.   Name and phone number of your driver:   family Special instructions:  Follow the diet and prep instructions given to you by Dr Patty Sermonsehman's office.  Please read over the following fact sheets that you were given. Anesthesia Post-op Instructions and Care and Recovery After Surgery       Colonoscopy, Adult A colonoscopy is an exam to look at the entire large intestine. During the exam, a lubricated, bendable tube is inserted into the anus and then passed into the rectum, colon, and other parts of the large intestine. A colonoscopy is often done as a part of normal colorectal screening or in response to certain symptoms, such as anemia, persistent diarrhea, abdominal pain, and blood in the stool. The exam can help screen for and diagnose medical problems, including:  Tumors.  Polyps.  Inflammation.  Areas of bleeding. Tell a health care provider about:  Any allergies you have.  All medicines you are  taking, including vitamins, herbs, eye drops, creams, and over-the-counter medicines.  Any problems you or family members have had with anesthetic medicines.  Any blood disorders you have.  Any surgeries you have had.  Any medical conditions you have.  Any problems you have had passing stool. What are the risks? Generally, this is a safe procedure. However, problems may occur, including:  Bleeding.  A tear in the intestine.  A reaction to medicines given during the exam.  Infection (rare). What happens before the procedure? Eating and drinking restrictions  Follow instructions from your health care provider about eating and drinking, which may include:  A few days before the procedure - follow a low-fiber diet. Avoid nuts, seeds, dried fruit, raw fruits, and vegetables.  1-3 days before the procedure - follow a clear liquid diet. Drink only clear liquids, such as clear broth or bouillon, black coffee or tea, clear juice, clear soft drinks or sports drinks, gelatin dessert, and popsicles. Avoid any liquids that contain red or purple dye.  On the day of the procedure - do not eat or drink anything during the 2 hours before the procedure, or within the time period that your health care provider recommends. Bowel prep  If you were prescribed an oral bowel prep to clean out your colon:  Take it as told by your health care provider. Starting the day before your procedure, you will  need to drink a large amount of medicated liquid. The liquid will cause you to have multiple loose stools until your stool is almost clear or light green.  If your skin or anus gets irritated from diarrhea, you may use these to relieve the irritation:  Medicated wipes, such as adult wet wipes with aloe and vitamin E.  A skin soothing-product like petroleum jelly.  If you vomit while drinking the bowel prep, take a break for up to 60 minutes and then begin the bowel prep again. If vomiting continues and  you cannot take the bowel prep without vomiting, call your health care provider. General instructions   Ask your health care provider about changing or stopping your regular medicines. This is especially important if you are taking diabetes medicines or blood thinners.  Plan to have someone take you home from the hospital or clinic. What happens during the procedure?  An IV tube may be inserted into one of your veins.  You will be given medicine to help you relax (sedative).  To reduce your risk of infection:  Your health care team will wash or sanitize their hands.  Your anal area will be washed with soap.  You will be asked to lie on your side with your knees bent.  Your health care provider will lubricate a long, thin, flexible tube. The tube will have a camera and a light on the end.  The tube will be inserted into your anus.  The tube will be gently eased through your rectum and colon.  Air will be delivered into your colon to keep it open. You may feel some pressure or cramping.  The camera will be used to take images during the procedure.  A small tissue sample may be removed from your body to be examined under a microscope (biopsy). If any potential problems are found, the tissue will be sent to a lab for testing.  If small polyps are found, your health care provider may remove them and have them checked for cancer cells.  The tube that was inserted into your anus will be slowly removed. The procedure may vary among health care providers and hospitals. What happens after the procedure?  Your blood pressure, heart rate, breathing rate, and blood oxygen level will be monitored until the medicines you were given have worn off.  Do not drive for 24 hours after the exam.  You may have a small amount of blood in your stool.  You may pass gas and have mild abdominal cramping or bloating due to the air that was used to inflate your colon during the exam.  It is up to you  to get the results of your procedure. Ask your health care provider, or the department performing the procedure, when your results will be ready. This information is not intended to replace advice given to you by your health care provider. Make sure you discuss any questions you have with your health care provider. Document Released: 06/20/2000 Document Revised: 04/23/2016 Document Reviewed: 09/04/2015 Elsevier Interactive Patient Education  2017 [employer]  Colonoscopy, Adult, Care After This sheet gives you information about how to care for yourself after your procedure. Your health care provider may also give you more specific instructions. If you have problems or questions, contact your health care provider. What can I expect after the procedure? After the procedure, it is common to have:  A small amount of blood in your stool for 24 hours after the procedure.  Some gas.  Mild abdominal cramping or bloating. Follow these instructions at home: General instructions    For the first 24 hours after the procedure:  Do not drive or use machinery.  Do not sign important documents.  Do not drink alcohol.  Do your regular daily activities at a slower pace than normal.  Eat soft, easy-to-digest foods.  Rest often.  Take over-the-counter or prescription medicines only as told by your health care provider.  It is up to you to get the results of your procedure. Ask your health care provider, or the department performing the procedure, when your results will be ready. Relieving cramping and bloating   Try walking around when you have cramps or feel bloated.  Apply heat to your abdomen as told by your health care provider. Use a heat source that your health care provider recommends, such as a moist heat pack or a heating pad.  Place a towel between your skin and the heat source.  Leave the heat on for 20-30 minutes.  Remove the heat if your skin turns bright red. This is  especially important if you are unable to feel pain, heat, or cold. You may have a greater risk of getting burned. Eating and drinking   Drink enough fluid to keep your urine clear or pale yellow.  Resume your normal diet as instructed by your health care provider. Avoid heavy or fried foods that are hard to digest.  Avoid drinking alcohol for as long as instructed by your health care provider. Contact a health care provider if:  You have blood in your stool 2-3 days after the procedure. Get help right away if:  You have more than a small spotting of blood in your stool.  You pass large blood clots in your stool.  Your abdomen is swollen.  You have nausea or vomiting.  You have a fever.  You have increasing abdominal pain that is not relieved with medicine. This information is not intended to replace advice given to you by your health care provider. Make sure you discuss any questions you have with your health care provider. Document Released: 02/05/2004 Document Revised: 03/17/2016 Document Reviewed: 09/04/2015 Elsevier Interactive Patient Education  2017 [place] Anesthesia is a term that refers to techniques, procedures, and medicines that help a person stay safe and comfortable during a medical procedure. Monitored anesthesia care, or sedation, is one type of anesthesia. Your anesthesia specialist may recommend sedation if you will be having a procedure that does not require you to be unconscious, such as:  Cataract surgery.  A dental procedure.  A biopsy.  A colonoscopy. During the procedure, you may receive a medicine to help you relax (sedative). There are three levels of sedation:  Mild sedation. At this level, you may feel awake and relaxed. You will be able to follow directions.  Moderate sedation. At this level, you will be sleepy. You may not remember the procedure.  Deep sedation. At this level, you will be asleep. You will not  remember the procedure. The more medicine you are given, the deeper your level of sedation will be. Depending on how you respond to the procedure, the anesthesia specialist may change your level of sedation or the type of anesthesia to fit your needs. An anesthesia specialist will monitor you closely during the procedure. Let your health care provider know about:  Any allergies you have.  All medicines you are taking, including vitamins, herbs, eye drops, creams, and over-the-counter medicines.  Any use of steroids (by mouth or as a cream).  Any problems you or family members have had with sedatives and anesthetic medicines.  Any blood disorders you have.  Any surgeries you have had.  Any medical conditions you have, such as sleep apnea.  Whether you are pregnant or may be pregnant.  Any use of cigarettes, alcohol, or street drugs. What are the risks? Generally, this is a safe procedure. However, problems may occur, including:  Getting too much medicine (oversedation).  Nausea.  Allergic reaction to medicines.  Trouble breathing. If this happens, a breathing tube may be used to help with breathing. It will be removed when you are awake and breathing on your own.  Heart trouble.  Lung trouble. Before the procedure Staying hydrated  Follow instructions from your health care provider about hydration, which may include:  Up to 2 hours before the procedure - you may continue to drink clear liquids, such as water, clear fruit juice, black coffee, and plain tea. Eating and drinking restrictions  Follow instructions from your health care provider about eating and drinking, which may include:  8 hours before the procedure - stop eating heavy meals or foods such as meat, fried foods, or fatty foods.  6 hours before the procedure - stop eating light meals or foods, such as toast or cereal.  6 hours before the procedure - stop drinking milk or drinks that contain milk.  2 hours  before the procedure - stop drinking clear liquids. Medicines  Ask your health care provider about:  Changing or stopping your regular medicines. This is especially important if you are taking diabetes medicines or blood thinners.  Taking medicines such as aspirin and ibuprofen. These medicines can thin your blood. Do not take these medicines before your procedure if your health care provider instructs you not to. Tests and exams  You will have a physical exam.  You may have blood tests done to show:  How well your kidneys and liver are working.  How well your blood can clot.  General instructions  Plan to have someone take you home from the hospital or clinic.  If you will be going home right after the procedure, plan to have someone with you for 24 hours. What happens during the procedure?  Your blood pressure, heart rate, breathing, level of pain and overall condition will be monitored.  An IV tube will be inserted into one of your veins.  Your anesthesia specialist will give you medicines as needed to keep you comfortable during the procedure. This may mean changing the level of sedation.  The procedure will be performed. After the procedure  Your blood pressure, heart rate, breathing rate, and blood oxygen level will be monitored until the medicines you were given have worn off.  Do not drive for 24 hours if you received a sedative.  You may:  Feel sleepy, clumsy, or nauseous.  Feel forgetful about what happened after the procedure.  Have a sore throat if you had a breathing tube during the procedure.  Vomit. This information is not intended to replace advice given to you by your health care provider. Make sure you discuss any questions you have with your health care provider. Document Released: 03/19/2005 Document Revised: 11/30/2015 Document Reviewed: 10/14/2015 Elsevier Interactive Patient Education  2017 [place], Care  After These instructions provide you with information about caring for yourself after your procedure. Your health care provider may also give you more specific instructions.  Your treatment has been planned according to current medical practices, but problems sometimes occur. Call your health care provider if you have any problems or questions after your procedure. What can I expect after the procedure? After your procedure, it is common to:  Feel sleepy for several hours.  Feel clumsy and have poor balance for several hours.  Feel forgetful about what happened after the procedure.  Have poor judgment for several hours.  Feel nauseous or vomit.  Have a sore throat if you had a breathing tube during the procedure. Follow these instructions at home: For at least 24 hours after the procedure:    Do not:  Participate in activities in which you could fall or become injured.  Drive.  Use heavy machinery.  Drink alcohol.  Take sleeping pills or medicines that cause drowsiness.  Make important decisions or sign legal documents.  Take care of children on your own.  Rest. Eating and drinking   Follow the diet that is recommended by your health care provider.  If you vomit, drink water, juice, or soup when you can drink without vomiting.  Make sure you have little or no nausea before eating solid foods. General instructions   Have a responsible adult stay with you until you are awake and alert.  Take over-the-counter and prescription medicines only as told by your health care provider.  If you smoke, do not smoke without supervision.  Keep all follow-up visits as told by your health care provider. This is important. Contact a health care provider if:  You keep feeling nauseous or you keep vomiting.  You feel light-headed.  You develop a rash.  You have a fever. Get help right away if:  You have trouble breathing. This information is not intended to replace advice  given to you by your health care provider. Make sure you discuss any questions you have with your health care provider. Document Released: 10/14/2015 Document Revised: 02/13/2016 Document Reviewed: 10/14/2015 Elsevier Interactive Patient Education  2017 [employer].

## 2016-09-16 ENCOUNTER — Other Ambulatory Visit (HOSPITAL_COMMUNITY): Payer: BLUE CROSS/BLUE SHIELD

## 2016-09-16 ENCOUNTER — Encounter (HOSPITAL_COMMUNITY)
Admission: RE | Admit: 2016-09-16 | Discharge: 2016-09-16 | Disposition: A | Discharge status: Home / Self Care | Payer: BLUE CROSS/BLUE SHIELD | Source: Ambulatory Visit | Attending: Internal Medicine | Admitting: Internal Medicine

## 2016-09-17 ENCOUNTER — Encounter (HOSPITAL_COMMUNITY)
Admission: RE | Admit: 2016-09-17 | Discharge: 2016-09-17 | Disposition: A | Discharge status: Home / Self Care | Payer: BLUE CROSS/BLUE SHIELD | Source: Ambulatory Visit | Attending: Internal Medicine | Admitting: Internal Medicine

## 2016-09-17 ENCOUNTER — Encounter (HOSPITAL_COMMUNITY): Payer: Self-pay

## 2016-09-17 ENCOUNTER — Other Ambulatory Visit: Payer: Self-pay

## 2016-09-17 DIAGNOSIS — K644 Residual hemorrhoidal skin tags: Secondary | ICD-10-CM | POA: Diagnosis not present

## 2016-09-17 DIAGNOSIS — Z7982 Long term (current) use of aspirin: Secondary | ICD-10-CM | POA: Diagnosis not present

## 2016-09-17 DIAGNOSIS — F17200 Nicotine dependence, unspecified, uncomplicated: Secondary | ICD-10-CM | POA: Diagnosis not present

## 2016-09-17 DIAGNOSIS — Z1211 Encounter for screening for malignant neoplasm of colon: Secondary | ICD-10-CM | POA: Diagnosis not present

## 2016-09-17 DIAGNOSIS — J410 Simple chronic bronchitis: Secondary | ICD-10-CM | POA: Diagnosis not present

## 2016-09-17 DIAGNOSIS — G8929 Other chronic pain: Secondary | ICD-10-CM | POA: Diagnosis not present

## 2016-09-17 DIAGNOSIS — M542 Cervicalgia: Secondary | ICD-10-CM | POA: Diagnosis not present

## 2016-09-17 DIAGNOSIS — Z79899 Other long term (current) drug therapy: Secondary | ICD-10-CM | POA: Diagnosis not present

## 2016-09-17 DIAGNOSIS — Z8249 Family history of ischemic heart disease and other diseases of the circulatory system: Secondary | ICD-10-CM | POA: Diagnosis not present

## 2016-09-17 HISTORY — DX: Cervicalgia: M54.2

## 2016-09-17 HISTORY — DX: Other chronic pain: G89.29

## 2016-09-17 LAB — CBC WITH DIFFERENTIAL/PLATELET
Basophils Absolute: 0.1 10*3/uL (ref 0.0–0.1)
Basophils Relative: 1 %
Eosinophils Absolute: 0.1 10*3/uL (ref 0.0–0.7)
Eosinophils Relative: 1 %
HCT: 43 % (ref 39.0–52.0)
Hemoglobin: 14.7 g/dL (ref 13.0–17.0)
Lymphocytes Relative: 35 %
Lymphs Abs: 3.8 10*3/uL (ref 0.7–4.0)
MCH: 31.7 pg (ref 26.0–34.0)
MCHC: 34.2 g/dL (ref 30.0–36.0)
MCV: 92.7 fL (ref 78.0–100.0)
Monocytes Absolute: 0.6 10*3/uL (ref 0.1–1.0)
Monocytes Relative: 5 %
Neutro Abs: 6.4 10*3/uL (ref 1.7–7.7)
Neutrophils Relative %: 58 %
Platelets: 339 10*3/uL (ref 150–400)
RBC: 4.64 MIL/uL (ref 4.22–5.81)
RDW: 14.2 % (ref 11.5–15.5)
WBC: 10.9 10*3/uL — ABNORMAL HIGH (ref 4.0–10.5)

## 2016-09-17 LAB — BASIC METABOLIC PANEL
Anion gap: 8 (ref 5–15)
BUN: 10 mg/dL (ref 6–20)
CO2: 26 mmol/L (ref 22–32)
Calcium: 8.8 mg/dL — ABNORMAL LOW (ref 8.9–10.3)
Chloride: 97 mmol/L — ABNORMAL LOW (ref 101–111)
Creatinine, Ser: 0.87 mg/dL (ref 0.61–1.24)
GFR calc Af Amer: >60 mL/min (ref >60)
GFR calc non Af Amer: >60 mL/min (ref >60)
Glucose, Bld: 106 mg/dL — ABNORMAL HIGH (ref 65–99)
Potassium: 3.5 mmol/L (ref 3.5–5.1)
Sodium: 131 mmol/L — ABNORMAL LOW (ref 135–145)

## 2016-09-19 ENCOUNTER — Encounter (HOSPITAL_COMMUNITY)
Admission: RE | Disposition: A | Discharge status: Home / Self Care | Payer: Self-pay | Source: Ambulatory Visit | Attending: Internal Medicine

## 2016-09-19 ENCOUNTER — Ambulatory Visit (HOSPITAL_COMMUNITY): Payer: BLUE CROSS/BLUE SHIELD | Admitting: Anesthesiology

## 2016-09-19 ENCOUNTER — Ambulatory Visit (HOSPITAL_COMMUNITY)
Admission: RE | Admit: 2016-09-19 | Discharge: 2016-09-19 | Disposition: A | Discharge status: Home / Self Care | Payer: BLUE CROSS/BLUE SHIELD | Source: Ambulatory Visit | Attending: Internal Medicine | Admitting: Internal Medicine

## 2016-09-19 ENCOUNTER — Encounter (HOSPITAL_COMMUNITY): Payer: Self-pay | Admitting: *Deleted

## 2016-09-19 DIAGNOSIS — F17200 Nicotine dependence, unspecified, uncomplicated: Secondary | ICD-10-CM | POA: Diagnosis not present

## 2016-09-19 DIAGNOSIS — Z7982 Long term (current) use of aspirin: Secondary | ICD-10-CM | POA: Diagnosis not present

## 2016-09-19 DIAGNOSIS — K644 Residual hemorrhoidal skin tags: Secondary | ICD-10-CM | POA: Insufficient documentation

## 2016-09-19 DIAGNOSIS — J410 Simple chronic bronchitis: Secondary | ICD-10-CM | POA: Diagnosis not present

## 2016-09-19 DIAGNOSIS — Z1211 Encounter for screening for malignant neoplasm of colon: Secondary | ICD-10-CM | POA: Diagnosis not present

## 2016-09-19 DIAGNOSIS — Z8249 Family history of ischemic heart disease and other diseases of the circulatory system: Secondary | ICD-10-CM | POA: Insufficient documentation

## 2016-09-19 DIAGNOSIS — Z79899 Other long term (current) drug therapy: Secondary | ICD-10-CM | POA: Diagnosis not present

## 2016-09-19 DIAGNOSIS — M542 Cervicalgia: Secondary | ICD-10-CM | POA: Insufficient documentation

## 2016-09-19 DIAGNOSIS — G8929 Other chronic pain: Secondary | ICD-10-CM | POA: Insufficient documentation

## 2016-09-19 HISTORY — PX: COLONOSCOPY WITH PROPOFOL: SHX5780

## 2016-09-19 SURGERY — COLONOSCOPY WITH PROPOFOL
Anesthesia: Monitor Anesthesia Care

## 2016-09-19 MED ORDER — MIDAZOLAM HCL 2 MG/2ML IJ SOLN
INTRAMUSCULAR | Status: AC
  Filled 2016-09-19: qty 2

## 2016-09-19 MED ORDER — FENTANYL CITRATE (PF) 100 MCG/2ML IJ SOLN
25.0000 ug | INTRAMUSCULAR | Status: AC
  Administered 2016-09-19 (×2): 25 ug via INTRAVENOUS

## 2016-09-19 MED ORDER — LACTATED RINGERS IV SOLN
INTRAVENOUS | Status: DC
  Administered 2016-09-19: 1000 mL via INTRAVENOUS

## 2016-09-19 MED ORDER — CHLORHEXIDINE GLUCONATE CLOTH 2 % EX PADS: 6.0000 | MEDICATED_PAD | Freq: Once | CUTANEOUS | Status: DC

## 2016-09-19 MED ORDER — FENTANYL CITRATE (PF) 100 MCG/2ML IJ SOLN
INTRAMUSCULAR | Status: AC
  Filled 2016-09-19: qty 2

## 2016-09-19 MED ORDER — PROPOFOL 10 MG/ML IV BOLUS
INTRAVENOUS | Status: AC
  Filled 2016-09-19: qty 40

## 2016-09-19 MED ORDER — PROPOFOL 10 MG/ML IV BOLUS
INTRAVENOUS | Status: DC | PRN
  Administered 2016-09-19 (×3): 10 mg via INTRAVENOUS

## 2016-09-19 MED ORDER — PROPOFOL 500 MG/50ML IV EMUL
INTRAVENOUS | Status: DC | PRN
  Administered 2016-09-19: 150 ug/kg/min via INTRAVENOUS
  Administered 2016-09-19: 10:00:00 via INTRAVENOUS

## 2016-09-19 MED ORDER — MIDAZOLAM HCL 2 MG/2ML IJ SOLN
1.0000 mg | INTRAMUSCULAR | Status: AC
  Administered 2016-09-19: 2 mg via INTRAVENOUS

## 2016-09-19 NOTE — H&P (Signed)
Jon Campbell is an 56 y.o. male.   Chief Complaint: Patient is here for colonoscopy. HPI: Patient is 56 year old Caucasian male who is here for screening colonoscopy. He denies abdominal pain change in bowel habits or rectal bleeding. Family history significant for CRC in maternal uncle was diagnosed at age 76 minutes surgery. He had another prominent 8 years later and died.  Past Medical History:  Diagnosis Date  . Neck pain, chronic     History reviewed. No pertinent surgical history.  Family History  Problem Relation Age of Onset  . Hypertension Mother   . Hypertension Father    Social History:  reports that he has been smoking.  He has never used smokeless tobacco. He reports that he does not drink alcohol or use drugs.  Allergies: No Known Allergies  Medications Prior to Admission  Medication Sig Dispense Refill  . acetaminophen (TYLENOL) 500 MG tablet Take 500 mg by mouth every 6 (six) hours as needed for moderate pain.    Marland Kitchen ALPRAZolam (XANAX) 0.5 MG tablet Take 1 tablet by mouth 3 (three) times daily as needed.  1  . Aspirin-Acetaminophen-Caffeine (GOODY HEADACHE PO) Take 1 Dose by mouth as needed.    . cyclobenzaprine (FLEXERIL) 10 MG tablet Take 10 mg by mouth 3 (three) times daily as needed for muscle spasms.    . Multiple Vitamin (MULTIVITAMIN WITH MINERALS) TABS tablet Take 1 tablet by mouth daily.    . naproxen (NAPROSYN) 500 MG tablet Take 1 tablet (500 mg total) by mouth 2 (two) times daily as needed. 20 tablet 0  . oxyCODONE-acetaminophen (PERCOCET/ROXICET) 5-325 MG per tablet Take 1 tablet by mouth every 4 (four) hours as needed for severe pain.      Results for orders placed or performed during the hospital encounter of 09/17/16 (from the past 48 hour(s))  Basic metabolic panel     Status: Abnormal   Collection Time: 09/17/16  2:53 PM  Result Value Ref Range   Sodium 131 (L) 135 - 145 mmol/L   Potassium 3.5 3.5 - 5.1 mmol/L   Chloride 97 (L) 101 - 111  mmol/L   CO2 26 22 - 32 mmol/L   Glucose, Bld 106 (H) 65 - 99 mg/dL   BUN 10 6 - 20 mg/dL   Creatinine, Ser 0.87 0.61 - 1.24 mg/dL   Calcium 8.8 (L) 8.9 - 10.3 mg/dL   GFR calc non Af Amer >60 >60 mL/min   GFR calc Af Amer >60 >60 mL/min    Comment: (NOTE) The eGFR has been calculated using the CKD EPI equation. This calculation has not been validated in all clinical situations. eGFR's persistently <60 mL/min signify possible Chronic Kidney Disease.    Anion gap 8 5 - 15  CBC with Differential/Platelet     Status: Abnormal   Collection Time: 09/17/16  2:53 PM  Result Value Ref Range   WBC 10.9 (H) 4.0 - 10.5 K/uL   RBC 4.64 4.22 - 5.81 MIL/uL   Hemoglobin 14.7 13.0 - 17.0 g/dL   HCT 43.0 39.0 - 52.0 %   MCV 92.7 78.0 - 100.0 fL   MCH 31.7 26.0 - 34.0 pg   MCHC 34.2 30.0 - 36.0 g/dL   RDW 14.2 11.5 - 15.5 %   Platelets 339 150 - 400 K/uL   Neutrophils Relative % 58 %   Neutro Abs 6.4 1.7 - 7.7 K/uL   Lymphocytes Relative 35 %   Lymphs Abs 3.8 0.7 - 4.0 K/uL  Monocytes Relative 5 %   Monocytes Absolute 0.6 0.1 - 1.0 K/uL   Eosinophils Relative 1 %   Eosinophils Absolute 0.1 0.0 - 0.7 K/uL   Basophils Relative 1 %   Basophils Absolute 0.1 0.0 - 0.1 K/uL   No results found.  ROS  Blood pressure 105/65, pulse 76, temperature 97.5 F (36.4 C), temperature source Oral, resp. rate 17, height '5\' 10"'$  (1.778 m), weight 155 lb (70.3 kg), SpO2 96 %. Physical Exam  Constitutional: He appears well-developed and well-nourished.  HENT:  Mouth/Throat: Oropharynx is clear and moist.  Eyes: Conjunctivae are normal. No scleral icterus.  Neck: No thyromegaly present.  Cardiovascular: Normal rate, regular rhythm and normal heart sounds.   No murmur heard. Respiratory: Effort normal and breath sounds normal.  GI: Soft. He exhibits no distension and no mass. There is no tenderness.  Musculoskeletal: He exhibits no edema.  Lymphadenopathy:    He has no cervical adenopathy.   Neurological: He is alert.  Skin: Skin is warm and dry.     Assessment/Plan Average risk screening colonoscopy.  Hildred Laser, MD 09/19/2016, 9:29 AM

## 2016-09-19 NOTE — Transfer of Care (Signed)
Immediate Anesthesia Transfer of Care Note  Patient: Jon Campbell  Procedure(s) Performed: Procedure(s) with comments: COLONOSCOPY WITH PROPOFOL (N/A) - 10:30  Patient Location: PACU  Anesthesia Type:MAC  Level of Consciousness: sedated  Airway & Oxygen Therapy: Patient Spontanous Breathing and Patient connected to face mask oxygen  Post-op Assessment: Report given to RN  Post vital signs: Reviewed and stable  Last Vitals:  Vitals:   09/19/16 0925 09/19/16 0930  BP: 105/83 113/72  Pulse:    Resp: 13 13  Temp:      Last Pain:  Vitals:   09/19/16 0941  TempSrc:   PainSc: 4       Patients Stated Pain Goal: 6 ([birth year] 6568)  Complications: No apparent anesthesia complications

## 2016-09-19 NOTE — Anesthesia Preprocedure Evaluation (Signed)
Anesthesia Evaluation  Patient identified by MRN, date of birth, ID band Patient awake    Reviewed: Allergy & Precautions, NPO status , Patient's Chart, lab work & pertinent test results  Airway Mallampati: II  TM Distance: >3 FB Neck ROM: Full    Dental  (+) Poor Dentition, Chipped,    Pulmonary Current Smoker (am cough),    breath sounds clear to auscultation       Cardiovascular negative cardio ROS   Rhythm:Regular Rate:Normal     Neuro/Psych    GI/Hepatic negative GI ROS,   Endo/Other    Renal/GU      Musculoskeletal Chronic neck pain    Abdominal   Peds  Hematology   Anesthesia Other Findings   Reproductive/Obstetrics                             Anesthesia Physical Anesthesia Plan  ASA: II  Anesthesia Plan: MAC   Post-op Pain Management:    Induction: Intravenous  Airway Management Planned: Simple Face Mask  Additional Equipment:   Intra-op Plan:   Post-operative Plan:   Informed Consent: I have reviewed the patients History and Physical, chart, labs and discussed the procedure including the risks, benefits and alternatives for the proposed anesthesia with the patient or authorized representative who has indicated his/her understanding and acceptance.     Plan Discussed with:   Anesthesia Plan Comments:         Anesthesia Quick Evaluation

## 2016-09-19 NOTE — Anesthesia Postprocedure Evaluation (Signed)
Anesthesia Post Note  Patient: Jon Campbell  Procedure(s) Performed: Procedure(s) (LRB): COLONOSCOPY WITH PROPOFOL (N/A)  Patient location during evaluation: PACU Anesthesia Type: MAC Level of consciousness: awake and alert and oriented Pain management: satisfactory to patient Vital Signs Assessment: post-procedure vital signs reviewed and stable Respiratory status: spontaneous breathing Cardiovascular status: stable Postop Assessment: no signs of nausea or vomiting Anesthetic complications: no     Last Vitals:  Vitals:   09/19/16 1045 09/19/16 1100  BP: 95/61 110/81  Pulse: (!) 53 69  Resp: 10 16  Temp:      Last Pain:  Vitals:   09/19/16 1100  TempSrc:   PainSc: 0-No pain                 Afreen Siebels

## 2016-09-19 NOTE — Discharge Instructions (Signed)
Resume usual medications and diet. No driving for 24 hours. Next screening exam in 10 years.     Colonoscopy, Adult, Care After This sheet gives you information about how to care for yourself after your procedure. Your doctor may also give you more specific instructions. If you have problems or questions, call your doctor. Follow these instructions at home: General instructions    For the first 24 hours after the procedure:  Do not drive or use machinery.  Do not sign important documents.  Do not drink alcohol.  Do your daily activities more slowly than normal.  Eat foods that are soft and easy to digest.  Rest often.  Take over-the-counter or prescription medicines only as told by your doctor.  It is up to you to get the results of your procedure. Ask your doctor, or the department performing the procedure, when your results will be ready. To help cramping and bloating:   Try walking around.  Put heat on your belly (abdomen) as told by your doctor. Use a heat source that your doctor recommends, such as a moist heat pack or a heating pad.  Put a towel between your skin and the heat source.  Leave the heat on for 20-30 minutes.  Remove the heat if your skin turns bright red. This is especially important if you cannot feel pain, heat, or cold. You can get burned. Eating and drinking   Drink enough fluid to keep your pee (urine) clear or pale yellow.  Return to your normal diet as told by your doctor. Avoid heavy or fried foods that are hard to digest.  Avoid drinking alcohol for as long as told by your doctor. Contact a doctor if:  You have blood in your poop (stool) 2-3 days after the procedure. Get help right away if:  You have more than a small amount of blood in your poop.  You see large clumps of tissue (blood clots) in your poop.  Your belly is swollen.  You feel sick to your stomach (nauseous).  You throw up (vomit).  You have a fever.  You have  belly pain that gets worse, and medicine does not help your pain. This information is not intended to replace advice given to you by your health care provider. Make sure you discuss any questions you have with your health care provider. Document Released: 07/26/2010 Document Revised: 03/17/2016 Document Reviewed: 03/17/2016 Elsevier Interactive Patient Education  2017 [employer]    PATIENT INSTRUCTIONS POST-ANESTHESIA  IMMEDIATELY FOLLOWING SURGERY:  Do not drive or operate machinery for the first twenty four hours after surgery.  Do not make any important decisions for twenty four hours after surgery or while taking narcotic pain medications or sedatives.  If you develop intractable nausea and vomiting or a severe headache please notify your doctor immediately.  FOLLOW-UP:  Please make an appointment with your surgeon as instructed. You do not need to follow up with anesthesia unless specifically instructed to do so.  WOUND CARE INSTRUCTIONS (if applicable):  Keep a dry clean dressing on the anesthesia/puncture wound site if there is drainage.  Once the wound has quit draining you may leave it open to air.  Generally you should leave the bandage intact for twenty four hours unless there is drainage.  If the epidural site drains for more than 36-48 hours please call the anesthesia department.  QUESTIONS?:  Please feel free to call your physician or the hospital operator if you have any questions, and they will  be happy to assist you.       Hemorrhoids Hemorrhoids are swollen veins in and around the rectum or anus. There are two types of hemorrhoids:  Internal hemorrhoids. These occur in the veins that are just inside the rectum. They may poke through to the outside and become irritated and painful.  External hemorrhoids. These occur in the veins that are outside of the anus and can be felt as a painful swelling or hard lump near the anus. Most hemorrhoids do not cause serious problems,  and they can be managed with home treatments such as diet and lifestyle changes. If home treatments do not help your symptoms, procedures can be done to shrink or remove the hemorrhoids. What are the causes? This condition is caused by increased pressure in the anal area. This pressure may result from various things, including:  Constipation.  Straining to have a bowel movement.  Diarrhea.  Pregnancy.  Obesity.  Sitting for long periods of time.  Heavy lifting or other activity that causes you to strain.  Anal sex. What are the signs or symptoms? Symptoms of this condition include:  Pain.  Anal itching or irritation.  Rectal bleeding.  Leakage of stool (feces).  Anal swelling.  One or more lumps around the anus. How is this diagnosed? This condition can often be diagnosed through a visual exam. Other exams or tests may also be done, such as:  Examination of the rectal area with a gloved hand (digital rectal exam).  Examination of the anal canal using a small tube (anoscope).  A blood test, if you have lost a significant amount of blood.  A test to look inside the colon (sigmoidoscopy or colonoscopy). How is this treated? This condition can usually be treated at home. However, various procedures may be done if dietary changes, lifestyle changes, and other home treatments do not help your symptoms. These procedures can help make the hemorrhoids smaller or remove them completely. Some of these procedures involve surgery, and others do not. Common procedures include:  Rubber band ligation. Rubber bands are placed at the base of the hemorrhoids to cut off the blood supply to them.  Sclerotherapy. Medicine is injected into the hemorrhoids to shrink them.  Infrared coagulation. A type of light energy is used to get rid of the hemorrhoids.  Hemorrhoidectomy surgery. The hemorrhoids are surgically removed, and the veins that supply them are tied off.  Stapled  hemorrhoidopexy surgery. A circular stapling device is used to remove the hemorrhoids and use staples to cut off the blood supply to them. Follow these instructions at home: Eating and drinking   Eat foods that have a lot of fiber in them, such as whole grains, beans, nuts, fruits, and vegetables. Ask your health care provider about taking products that have added fiber (fiber supplements).  Drink enough fluid to keep your urine clear or pale yellow. Managing pain and swelling   Take warm sitz baths for 20 minutes, 3-4 times a day to ease pain and discomfort.  If directed, apply ice to the affected area. Using ice packs between sitz baths may be helpful.  Put ice in a plastic bag.  Place a towel between your skin and the bag.  Leave the ice on for 20 minutes, 2-3 times a day. General instructions   Take over-the-counter and prescription medicines only as told by your health care provider.  Use medicated creams or suppositories as told.  Exercise regularly.  Go to the bathroom when you have  the urge to have a bowel movement. Do not wait.  Avoid straining to have bowel movements.  Keep the anal area dry and clean. Use wet toilet paper or moist towelettes after a bowel movement.  Do not sit on the toilet for long periods of time. This increases blood pooling and pain. Contact a health care provider if:  You have increasing pain and swelling that are not controlled by treatment or medicine.  You have uncontrolled bleeding.  You have difficulty having a bowel movement, or you are unable to have a bowel movement.  You have pain or inflammation outside the area of the hemorrhoids. This information is not intended to replace advice given to you by your health care provider. Make sure you discuss any questions you have with your health care provider. Document Released: 06/20/2000 Document Revised: 11/21/2015 Document Reviewed: 03/07/2015 Elsevier Interactive Patient Education   2017 [employer].

## 2016-09-19 NOTE — Op Note (Signed)
[facility] Patient Name: Jon Campbell Procedure Date: 09/19/2016 8:31 AM MRN: [id number] Date of Birth: 05/13/1961 Attending MD: Lionel December , MD CSN: [id number] Age: 56 Admit Type: Outpatient Procedure:                Colonoscopy Indications:              Screening for colorectal malignant neoplasm Providers:                Lionel December, MD, Loma Messing B. Patsy Lager, RN, Casimer Leek, Technician Referring MD:             Elfredia Nevins, MD Medicines:                Propofol per Anesthesia Complications:            No immediate complications. Estimated Blood Loss:     Estimated blood loss: none. Procedure:                Pre-Anesthesia Assessment:                           - Prior to the procedure, a History and Physical                            was performed, and patient medications and                            allergies were reviewed. The patient's tolerance of                            previous anesthesia was also reviewed. The risks                            and benefits of the procedure and the sedation                            options and risks were discussed with the patient.                            All questions were answered, and informed consent                            was obtained. Prior Anticoagulants: The patient                            last took aspirin on the day of the procedure. ASA                            Grade Assessment: II - A patient with mild systemic                            disease. After reviewing the risks and benefits,  the patient was deemed in satisfactory condition to                            undergo the procedure.                           After obtaining informed consent, the colonoscope                            was passed under direct vision. Throughout the                            procedure, the patient's blood pressure, pulse, and   oxygen saturations were monitored continuously. The                            [id number] ([id number]) scope was introduced through                            the anus and advanced to the the cecum, identified                            by appendiceal orifice and ileocecal valve. The                            colonoscopy was somewhat difficult due to a                            tortuous colon. Successful completion of the                            procedure was aided by changing the patient's                            position and withdrawing and reinserting the scope.                            The patient tolerated the procedure well. The                            quality of the bowel preparation was good. The                            ileocecal valve, appendiceal orifice, and rectum                            were photographed. Scope In: 9:46:00 AM Scope Out: 10:04:18 AM Scope Withdrawal Time: 0 hours 8 minutes 20 seconds  Total Procedure Duration: 0 hours 18 minutes 18 seconds  Findings:      The perianal and digital rectal examinations were normal.      The colon (entire examined portion) appeared normal.      External hemorrhoids were found during retroflexion. The hemorrhoids       were small. Impression:               -  The entire examined colon is normal.                           - External hemorrhoids.                           - No specimens collected. Moderate Sedation:      Per Anesthesia Care Recommendation:           - Patient has a contact number available for                            emergencies. The signs and symptoms of potential                            delayed complications were discussed with the                            patient. Return to normal activities tomorrow.                            Written discharge instructions were provided to the                            patient.                           - Resume previous diet today.                            - Continue present medications.                           - Repeat colonoscopy in 10 years for screening                            purposes. Procedure Code(s):        --- Professional ---                           [id number], Colonoscopy, flexible; diagnostic, including                            collection of specimen(s) by brushing or washing,                            when performed (separate procedure) Diagnosis Code(s):        --- Professional ---                           Z12.11, Encounter for screening for malignant                            neoplasm of colon                           K64.4, Residual hemorrhoidal skin tags CPT copyright 2016 American [employer] Association. All  rights reserved. The codes documented in this report are preliminary and upon coder review may  be revised to meet current compliance requirements. Lionel December, MD Lionel December, MD 09/19/2016 10:10:04 AM This report has been signed electronically. Number of Addenda: 0

## 2016-09-22 ENCOUNTER — Encounter (HOSPITAL_COMMUNITY): Payer: Self-pay | Admitting: Internal Medicine

## 2016-10-30 DIAGNOSIS — F419 Anxiety disorder, unspecified: Secondary | ICD-10-CM | POA: Diagnosis not present

## 2016-10-30 DIAGNOSIS — Z1389 Encounter for screening for other disorder: Secondary | ICD-10-CM | POA: Diagnosis not present

## 2017-01-21 DIAGNOSIS — Z6822 Body mass index (BMI) 22.0-22.9, adult: Secondary | ICD-10-CM | POA: Diagnosis not present

## 2017-01-21 DIAGNOSIS — Z1389 Encounter for screening for other disorder: Secondary | ICD-10-CM | POA: Diagnosis not present

## 2017-01-21 DIAGNOSIS — M47812 Spondylosis without myelopathy or radiculopathy, cervical region: Secondary | ICD-10-CM | POA: Diagnosis not present

## 2017-01-21 DIAGNOSIS — G894 Chronic pain syndrome: Secondary | ICD-10-CM | POA: Diagnosis not present

## 2017-01-21 DIAGNOSIS — F419 Anxiety disorder, unspecified: Secondary | ICD-10-CM | POA: Diagnosis not present

## 2017-04-17 DIAGNOSIS — M5412 Radiculopathy, cervical region: Secondary | ICD-10-CM | POA: Diagnosis not present

## 2017-04-17 DIAGNOSIS — Z1389 Encounter for screening for other disorder: Secondary | ICD-10-CM | POA: Diagnosis not present

## 2017-04-17 DIAGNOSIS — G894 Chronic pain syndrome: Secondary | ICD-10-CM | POA: Diagnosis not present

## 2017-04-17 DIAGNOSIS — F419 Anxiety disorder, unspecified: Secondary | ICD-10-CM | POA: Diagnosis not present

## 2017-04-17 DIAGNOSIS — Z6821 Body mass index (BMI) 21.0-21.9, adult: Secondary | ICD-10-CM | POA: Diagnosis not present

## 2017-06-21 ENCOUNTER — Emergency Department (HOSPITAL_COMMUNITY)
Admission: EM | Admit: 2017-06-21 | Discharge: 2017-06-21 | Disposition: A | Discharge status: Home / Self Care | Payer: BLUE CROSS/BLUE SHIELD | Attending: Emergency Medicine | Admitting: Emergency Medicine

## 2017-06-21 ENCOUNTER — Encounter (HOSPITAL_COMMUNITY): Payer: Self-pay | Admitting: Emergency Medicine

## 2017-06-21 ENCOUNTER — Emergency Department (HOSPITAL_COMMUNITY): Payer: BLUE CROSS/BLUE SHIELD

## 2017-06-21 ENCOUNTER — Other Ambulatory Visit: Payer: Self-pay

## 2017-06-21 DIAGNOSIS — Z79899 Other long term (current) drug therapy: Secondary | ICD-10-CM | POA: Insufficient documentation

## 2017-06-21 DIAGNOSIS — R1084 Generalized abdominal pain: Secondary | ICD-10-CM | POA: Diagnosis not present

## 2017-06-21 DIAGNOSIS — R112 Nausea with vomiting, unspecified: Secondary | ICD-10-CM | POA: Insufficient documentation

## 2017-06-21 DIAGNOSIS — R1110 Vomiting, unspecified: Secondary | ICD-10-CM | POA: Diagnosis not present

## 2017-06-21 DIAGNOSIS — F17200 Nicotine dependence, unspecified, uncomplicated: Secondary | ICD-10-CM | POA: Diagnosis not present

## 2017-06-21 DIAGNOSIS — E860 Dehydration: Secondary | ICD-10-CM | POA: Insufficient documentation

## 2017-06-21 LAB — URINALYSIS, ROUTINE W REFLEX MICROSCOPIC
Bacteria, UA: NONE SEEN
Bilirubin Urine: NEGATIVE
Glucose, UA: NEGATIVE mg/dL
Hgb urine dipstick: NEGATIVE
Ketones, ur: 20 mg/dL — AB
Leukocytes, UA: NEGATIVE
Nitrite: NEGATIVE
Protein, ur: 100 mg/dL — AB
Specific Gravity, Urine: >1.046 — ABNORMAL HIGH (ref 1.005–1.030)
Squamous Epithelial / LPF: NONE SEEN
pH: 7 (ref 5.0–8.0)

## 2017-06-21 LAB — CBC
HCT: 49.8 % (ref 39.0–52.0)
Hemoglobin: 17.1 g/dL — ABNORMAL HIGH (ref 13.0–17.0)
MCH: 32.3 pg (ref 26.0–34.0)
MCHC: 34.3 g/dL (ref 30.0–36.0)
MCV: 94.1 fL (ref 78.0–100.0)
Platelets: 409 10*3/uL — ABNORMAL HIGH (ref 150–400)
RBC: 5.29 MIL/uL (ref 4.22–5.81)
RDW: 13.5 % (ref 11.5–15.5)
WBC: 13.6 10*3/uL — ABNORMAL HIGH (ref 4.0–10.5)

## 2017-06-21 LAB — COMPREHENSIVE METABOLIC PANEL
ALT: 20 U/L (ref 17–63)
AST: 25 U/L (ref 15–41)
Albumin: 4.3 g/dL (ref 3.5–5.0)
Alkaline Phosphatase: 51 U/L (ref 38–126)
Anion gap: 13 (ref 5–15)
BUN: 19 mg/dL (ref 6–20)
CO2: 30 mmol/L (ref 22–32)
Calcium: 9.8 mg/dL (ref 8.9–10.3)
Chloride: 96 mmol/L — ABNORMAL LOW (ref 101–111)
Creatinine, Ser: 0.84 mg/dL (ref 0.61–1.24)
GFR calc Af Amer: >60 mL/min (ref >60)
GFR calc non Af Amer: >60 mL/min (ref >60)
Glucose, Bld: 104 mg/dL — ABNORMAL HIGH (ref 65–99)
Potassium: 3.3 mmol/L — ABNORMAL LOW (ref 3.5–5.1)
Sodium: 139 mmol/L (ref 135–145)
Total Bilirubin: 0.6 mg/dL (ref 0.3–1.2)
Total Protein: 7.8 g/dL (ref 6.5–8.1)

## 2017-06-21 LAB — LIPASE, BLOOD: Lipase: 25 U/L (ref 11–51)

## 2017-06-21 MED ORDER — SODIUM CHLORIDE 0.9 % IV BOLUS (SEPSIS)
2000.0000 mL | Freq: Once | INTRAVENOUS | Status: AC
  Administered 2017-06-21: 2000 mL via INTRAVENOUS

## 2017-06-21 MED ORDER — ONDANSETRON 4 MG PO TBDP
4.0000 mg | ORAL_TABLET | Freq: Once | ORAL | Status: AC | PRN
  Administered 2017-06-21: 4 mg via ORAL
  Filled 2017-06-21: qty 1

## 2017-06-21 MED ORDER — ONDANSETRON HCL 4 MG/2ML IJ SOLN
4.0000 mg | Freq: Once | INTRAMUSCULAR | Status: AC
  Administered 2017-06-21: 4 mg via INTRAVENOUS
  Filled 2017-06-21: qty 2

## 2017-06-21 MED ORDER — FAMOTIDINE IN NACL 20-0.9 MG/50ML-% IV SOLN
20.0000 mg | Freq: Once | INTRAVENOUS | Status: AC
  Administered 2017-06-21: 20 mg via INTRAVENOUS
  Filled 2017-06-21: qty 50

## 2017-06-21 MED ORDER — ONDANSETRON 8 MG PO TBDP: 8.0000 mg | ORAL_TABLET | Freq: Three times a day (TID) | ORAL | 0 refills | Status: DC | PRN

## 2017-06-21 MED ORDER — KETOROLAC TROMETHAMINE 30 MG/ML IJ SOLN
15.0000 mg | Freq: Once | INTRAMUSCULAR | Status: AC
  Administered 2017-06-21: 15 mg via INTRAVENOUS
  Filled 2017-06-21: qty 1

## 2017-06-21 MED ORDER — IOPAMIDOL (ISOVUE-300) INJECTION 61%
100.0000 mL | Freq: Once | INTRAVENOUS | Status: AC | PRN
  Administered 2017-06-21: 100 mL via INTRAVENOUS

## 2017-06-21 NOTE — ED Triage Notes (Addendum)
Pt reports nausea/vomiting, body aches since Friday. Pt unsure of fever. Pt states he feels like he may have been given something to make him sick and "would like to be checked". Pt does not want this disclosed to family

## 2017-06-21 NOTE — ED Provider Notes (Signed)
[facility]GENCY DEPARTMENT Provider Note   CSN: [id number] Arrival date & time: 06/21/17  1600     History   Chief Complaint Chief Complaint  Patient presents with  . Emesis    HPI Jon Campbell is a 56 y.o. male.  HPI Patient presents to the emergency room for evaluation body aches.  Symptoms started on Friday.  Since then he has had multiple episodes of nausea and vomiting.  The last time he vomited he thought he noticed some blood specks.  He has had a few episodes of diarrhea but overall has had many more episodes of vomiting.  He said some generalized abdominal cramping.  His throat is a bit sore from the vomiting.  No dysuria.  Patient is also having aches and pains.  He usually takes oxycodone and Xanax and has not been able to take those medications. Past Medical History:  Diagnosis Date  . Neck pain, chronic     Patient Active Problem List   Diagnosis Date Noted  . Special screening for malignant neoplasms, colon 08/18/2016    Past Surgical History:  Procedure Laterality Date  . COLONOSCOPY WITH PROPOFOL N/A 09/19/2016   Procedure: COLONOSCOPY WITH PROPOFOL;  Surgeon: Malissa HippoNajeeb U Rehman, MD;  Location: [facility];  Service: Endoscopy;  Laterality: N/A;  10:30       Home Medications    Prior to Admission medications   Medication Sig Start Date End Date Taking? Authorizing Provider  ALPRAZolam Prudy Feeler(XANAX) 0.5 MG tablet Take 1 tablet by mouth 3 (three) times daily as needed. 08/07/16  Yes [provider]  Aspirin-Acetaminophen-Caffeine (GOODY HEADACHE PO) Take 1 Dose by mouth as needed.   Yes [provider]  cyclobenzaprine (FLEXERIL) 10 MG tablet Take 10 mg by mouth 3 (three) times daily as needed for muscle spasms.   Yes [provider]  Multiple Vitamin (MULTIVITAMIN WITH MINERALS) TABS tablet Take 1 tablet by mouth daily.   Yes [provider]  oxyCODONE-acetaminophen (PERCOCET/ROXICET) 5-325 MG per tablet Take 1 tablet  by mouth every 4 (four) hours as needed for severe pain.   Yes [provider]  ondansetron (ZOFRAN ODT) 8 MG disintegrating tablet Take 1 tablet (8 mg total) by mouth every 8 (eight) hours as needed for nausea or vomiting. 06/21/17   Linwood DibblesKnapp, Timothee Gali, MD    Family History Family History  Problem Relation Age of Onset  . Hypertension Mother   . Hypertension Father     Social History Social History   Tobacco Use  . Smoking status: Current Every Day Smoker  . Smokeless tobacco: Never Used  Substance Use Topics  . Alcohol use: No  . Drug use: No     Allergies   Patient has no known allergies.   Review of Systems Review of Systems  All other systems reviewed and are negative.    Physical Exam Updated Vital Signs BP (!) 143/89   Pulse 79   Temp 98.3 F (36.8 C) (Oral)   Resp 18   Ht 1.778 m (5\' 10" )   Wt 70.3 kg (155 lb)   SpO2 96%   BMI 22.24 kg/m   Physical Exam  Constitutional: He appears well-developed and well-nourished. No distress.  HENT:  Head: Normocephalic and atraumatic.  Right Ear: External ear normal.  Left Ear: External ear normal.  Mucous membranes are dry  Eyes: Conjunctivae are normal. Right eye exhibits no discharge. Left eye exhibits no discharge. No scleral icterus.  Neck: Neck supple. No tracheal  deviation present.  Cardiovascular: Normal rate, regular rhythm and intact distal pulses.  Pulmonary/Chest: Effort normal and breath sounds normal. No stridor. No respiratory distress. He has no wheezes. He has no rales.  Abdominal: Soft. Bowel sounds are normal. He exhibits no distension. There is tenderness. There is no rebound and no guarding.  Musculoskeletal: He exhibits no edema or tenderness.  Neurological: He is alert. He has normal strength. No cranial nerve deficit (no facial droop, extraocular movements intact, no slurred speech) or sensory deficit. He exhibits normal muscle tone. He displays no seizure activity. Coordination normal.    Skin: Skin is warm and dry. No rash noted.  Psychiatric: He has a normal mood and affect.  Nursing note and vitals reviewed.    ED Treatments / Results  Labs (all labs ordered are listed, but only abnormal results are displayed) Labs Reviewed  COMPREHENSIVE METABOLIC PANEL - Abnormal; Notable for the following components:      Result Value   Potassium 3.3 (*)    Chloride 96 (*)    Glucose, Bld 104 (*)    All other components within normal limits  CBC - Abnormal; Notable for the following components:   WBC 13.6 (*)    Hemoglobin 17.1 (*)    Platelets 409 (*)    All other components within normal limits  URINALYSIS, ROUTINE W REFLEX MICROSCOPIC - Abnormal; Notable for the following components:   Specific Gravity, Urine >1.046 (*)    Ketones, ur 20 (*)    Protein, ur 100 (*)    All other components within normal limits  LIPASE, BLOOD    Radiology Ct Abdomen Pelvis W Contrast  Result Date: 06/21/2017 CLINICAL DATA:  Nausea and vomiting with pain EXAM: CT ABDOMEN AND PELVIS WITH CONTRAST TECHNIQUE: Multidetector CT imaging of the abdomen and pelvis was performed using the standard protocol following bolus administration of intravenous contrast. CONTRAST:  100mL ISOVUE-300 IOPAMIDOL (ISOVUE-300) INJECTION 61% COMPARISON:  None. FINDINGS: Lower chest: There is atelectatic change in the posterior right base. Lung bases otherwise are clear. Hepatobiliary: There is fatty infiltration near the fissure for the ligamentum teres. There are scattered subcentimeter foci of decreased attenuation in the liver, likely either small cysts or hamartomas. No larger lesions are evident within the liver. Gallbladder wall is not appreciably thickened. There is no biliary duct dilatation. Pancreas: There is no pancreatic mass or inflammatory focus. Spleen: No splenic lesions are evident. Adrenals/Urinary Tract: Adrenals appear unremarkable bilaterally. There is a cyst in the posterior mid right kidney  measuring 1.0 x 1.0 cm. A second cyst in the mid right kidney measures 7 x 7 mm. There is a 5 mm cyst in the anterior mid left kidney. There is no evident hydronephrosis on either side. There is no appreciable renal or ureteral calculus on either side. Urinary bladder is midline with wall thickness within normal limits. Stomach/Bowel: There is no appreciable bowel wall or mesenteric thickening. There is no evident bowel obstruction. No free air or portal venous air. Vascular/Lymphatic: There are foci of atherosclerotic calcification in the aorta. There is no abdominal aortic aneurysm. Major mesenteric vessels appear patent. There is no appreciable adenopathy in the abdomen or pelvis. Reproductive: Prostate and seminal vesicles appear normal in size and contour. There is a single small calcification in the prostate. No pelvic masses appreciable. Other: Appendix appears normal. There is no abscess or ascites in the abdomen or pelvis. Musculoskeletal: There are foci of degenerative change in the lumbar spine. There is no blastic or  lytic bone lesion. There is no intramuscular or abdominal wall lesion. IMPRESSION: 1.  Appendix appears normal. 2.  No appreciable renal or ureteral calculus.  No hydronephrosis. 3.  No bowel obstruction.  No abscess. 4.  There are foci of atherosclerotic calcification in the aorta. Aortic Atherosclerosis (ICD10-I70.0). Electronically Signed   By: Bretta Bang III M.D.   On: 06/21/2017 20:13    Procedures Procedures (including critical care time)  Medications Ordered in ED Medications  ondansetron (ZOFRAN-ODT) disintegrating tablet 4 mg (4 mg Oral Given 06/21/17 1621)  sodium chloride 0.9 % bolus 2,000 mL (0 mLs Intravenous Stopped 06/21/17 2145)  ondansetron (ZOFRAN) injection 4 mg (4 mg Intravenous Given 06/21/17 1904)  famotidine (PEPCID) IVPB 20 mg premix (0 mg Intravenous Stopped 06/21/17 2040)  ketorolac (TORADOL) 30 MG/ML injection 15 mg (15 mg Intravenous Given  06/21/17 1938)  iopamidol (ISOVUE-300) 61 % injection 100 mL (100 mLs Intravenous Contrast Given 06/21/17 1943)     Initial Impression / Assessment and Plan / ED Course  I have reviewed the triage vital signs and the nursing notes.  Pertinent labs & imaging results that were available during my care of the patient were reviewed by me and considered in my medical decision making (see chart for details).   Patient presented to the emergency room for evaluation of nausea, and vomiting.  His laboratory tests were notable for an elevated white blood cell count.  His urinalysis was also very concentrated.  Patient was given IV fluids, Zofran, Pepcid and Toradol.  Patient symptoms improved significantly after treatment.  He is feeling much better.  Did order a CT scan of his abdomen pelvis considering his abdominal pain and no white blood cell count.  CT scan does not show any acute abnormalities.  Suspect his symptoms may be related to a viral illness.  Patient has improved significantly and is ready for discharge  Final Clinical Impressions(s) / ED Diagnoses   Final diagnoses:  Non-intractable vomiting with nausea, unspecified vomiting type  Dehydration    ED Discharge Orders        Ordered    ondansetron (ZOFRAN ODT) 8 MG disintegrating tablet  Every 8 hours PRN     06/21/17 2143       Linwood Dibbles, MD 06/21/17 2147

## 2017-06-21 NOTE — Discharge Instructions (Signed)
Drink plenty of fluids, rest, take the medications as needed for nausea.  Follow-up with your doctor later this week to be rechecked

## 2017-06-24 DIAGNOSIS — Z6822 Body mass index (BMI) 22.0-22.9, adult: Secondary | ICD-10-CM | POA: Diagnosis not present

## 2017-06-24 DIAGNOSIS — Z1389 Encounter for screening for other disorder: Secondary | ICD-10-CM | POA: Diagnosis not present

## 2017-06-24 DIAGNOSIS — F419 Anxiety disorder, unspecified: Secondary | ICD-10-CM | POA: Diagnosis not present

## 2017-06-24 DIAGNOSIS — G894 Chronic pain syndrome: Secondary | ICD-10-CM | POA: Diagnosis not present

## 2017-06-24 DIAGNOSIS — M5030 Other cervical disc degeneration, unspecified cervical region: Secondary | ICD-10-CM | POA: Diagnosis not present

## 2017-06-24 DIAGNOSIS — K529 Noninfective gastroenteritis and colitis, unspecified: Secondary | ICD-10-CM | POA: Diagnosis not present

## 2017-09-30 DIAGNOSIS — M47812 Spondylosis without myelopathy or radiculopathy, cervical region: Secondary | ICD-10-CM | POA: Diagnosis not present

## 2017-09-30 DIAGNOSIS — Z1389 Encounter for screening for other disorder: Secondary | ICD-10-CM | POA: Diagnosis not present

## 2017-09-30 DIAGNOSIS — Z6823 Body mass index (BMI) 23.0-23.9, adult: Secondary | ICD-10-CM | POA: Diagnosis not present

## 2017-09-30 DIAGNOSIS — G894 Chronic pain syndrome: Secondary | ICD-10-CM | POA: Diagnosis not present

## 2017-09-30 DIAGNOSIS — F419 Anxiety disorder, unspecified: Secondary | ICD-10-CM | POA: Diagnosis not present

## 2017-12-18 DIAGNOSIS — F419 Anxiety disorder, unspecified: Secondary | ICD-10-CM | POA: Diagnosis not present

## 2017-12-18 DIAGNOSIS — G894 Chronic pain syndrome: Secondary | ICD-10-CM | POA: Diagnosis not present

## 2017-12-18 DIAGNOSIS — Z0000 Encounter for general adult medical examination without abnormal findings: Secondary | ICD-10-CM | POA: Diagnosis not present

## 2017-12-18 DIAGNOSIS — Z6822 Body mass index (BMI) 22.0-22.9, adult: Secondary | ICD-10-CM | POA: Diagnosis not present

## 2017-12-18 DIAGNOSIS — Z1389 Encounter for screening for other disorder: Secondary | ICD-10-CM | POA: Diagnosis not present

## 2017-12-18 DIAGNOSIS — M5030 Other cervical disc degeneration, unspecified cervical region: Secondary | ICD-10-CM | POA: Diagnosis not present

## 2018-01-25 IMAGING — CT CT ABD-PELV W/ CM
2 of 5 series · 16 of 46 positions shown, 18 images · IV contrast (Isovue)
Comparison: None.

CLINICAL DATA: Nausea and vomiting with pain

EXAM:
CT ABDOMEN AND PELVIS WITH CONTRAST
TECHNIQUE: Multidetector CT imaging of the abdomen and pelvis was performed
using the standard protocol following bolus administration of
intravenous contrast.
CONTRAST:  100mL [id number] IOPAMIDOL ([id number]) INJECTION 61%

[Series 2: axial st · axial · 0.67mm/px · z∈[+857,+1232]mm · 13 of 85 slices shown, 15 images]
[im 5/85  soft-tissue]
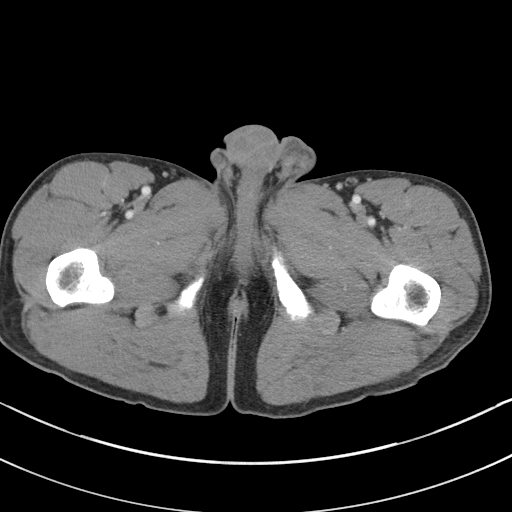
[im 5/85  bone]
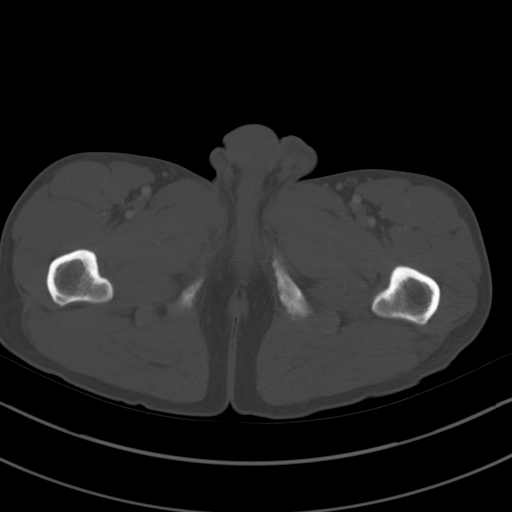
[im 14/85  soft-tissue]
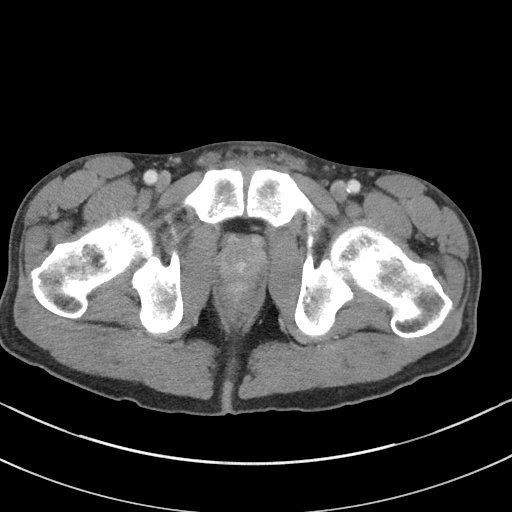
[im 18/85  soft-tissue]
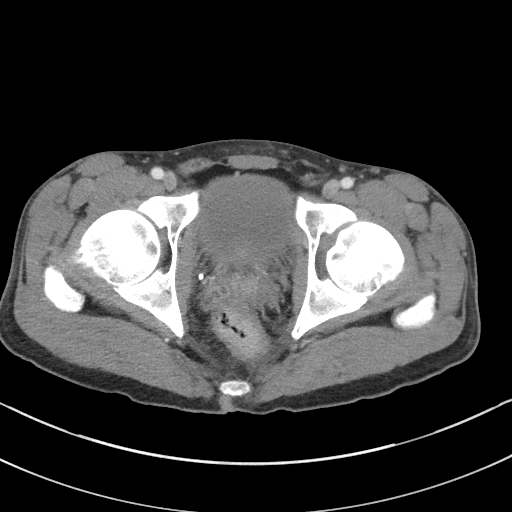
[im 23/85  soft-tissue]
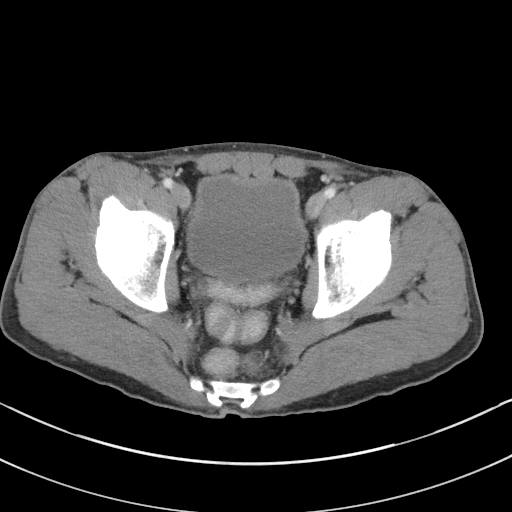
[im 31/85  soft-tissue]
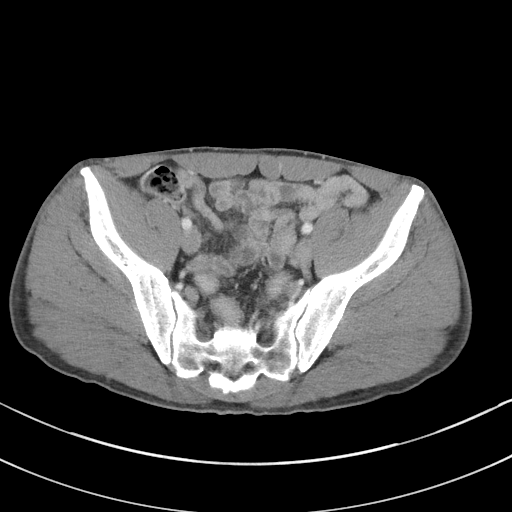
[im 36/85  soft-tissue]
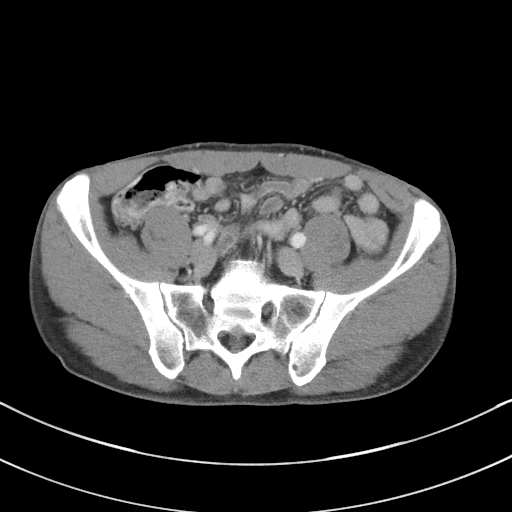
[im 45/85  soft-tissue]
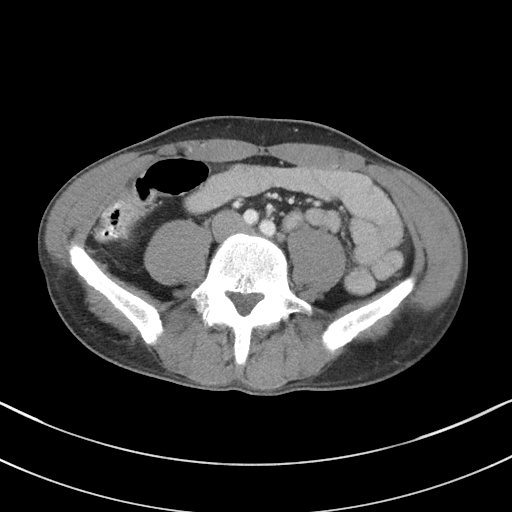
[im 49/85  soft-tissue]
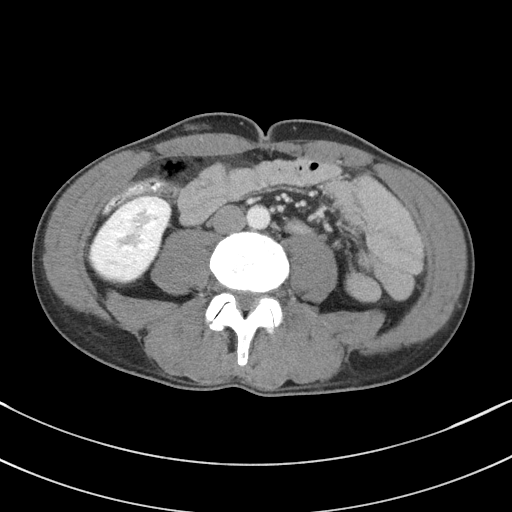
[im 54/85  soft-tissue]
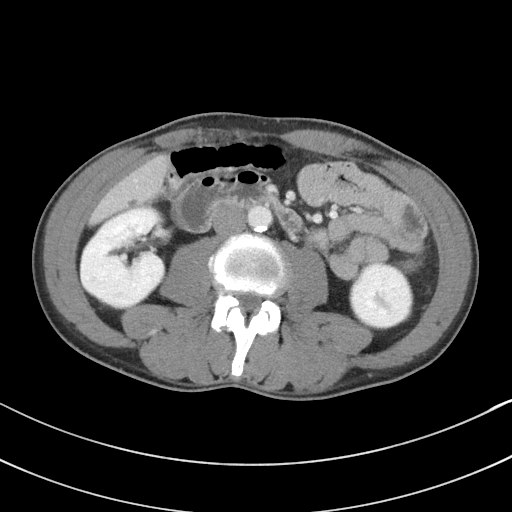
[im 54/85  bone]
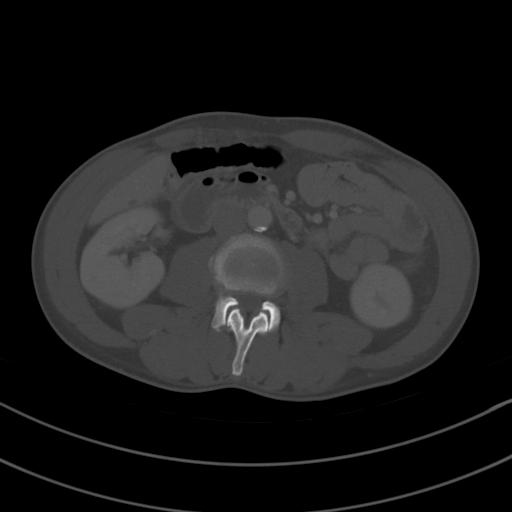
[im 62/85  soft-tissue]
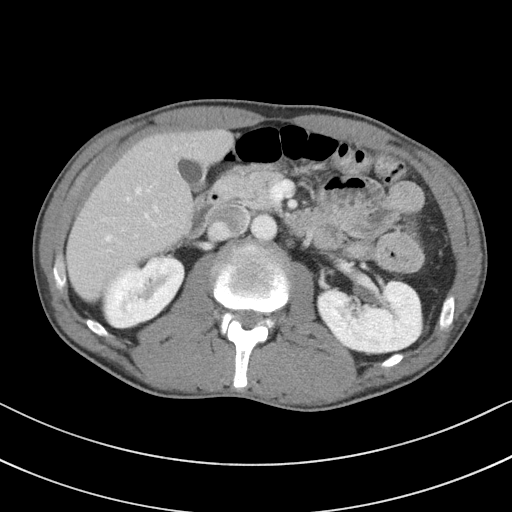
[im 67/85  soft-tissue]
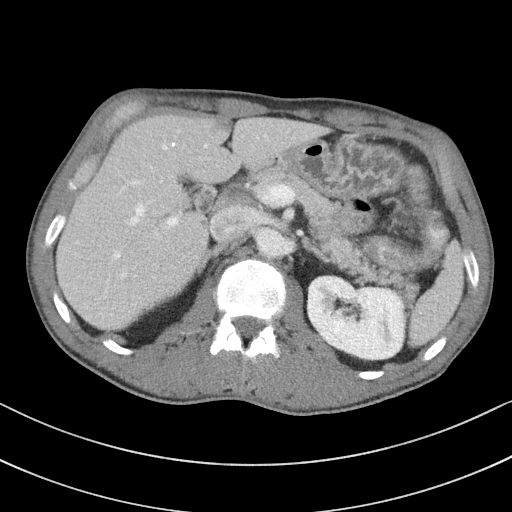
[im 71/85  soft-tissue]
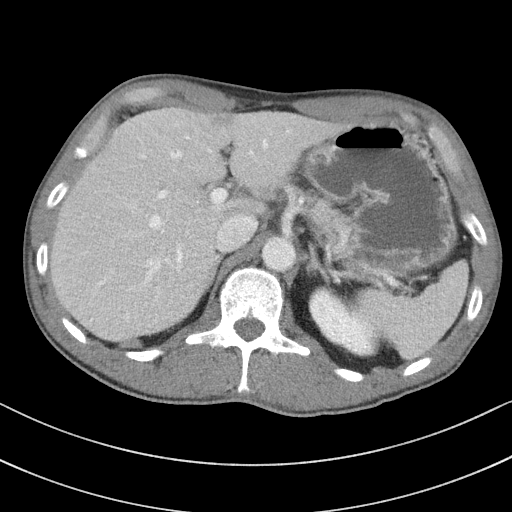
[im 80/85  soft-tissue]
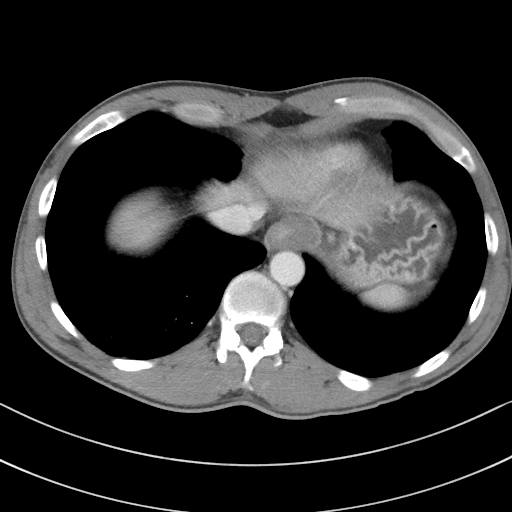

[Series 5: coronal st · coronal · 0.74mm/px · 3 of 78 slices shown]
[im 26/78  soft-tissue]
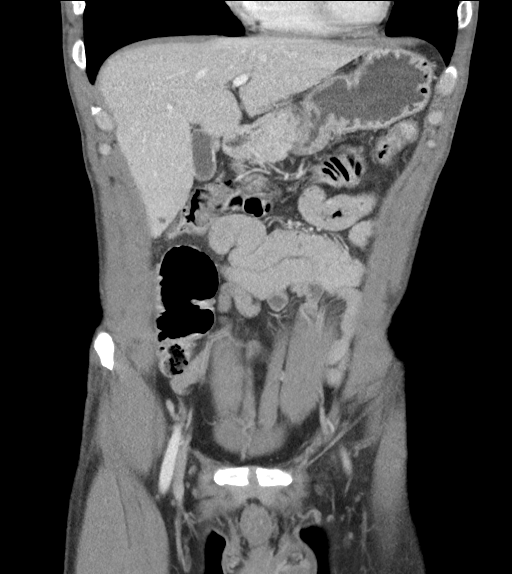
[im 35/78  soft-tissue]
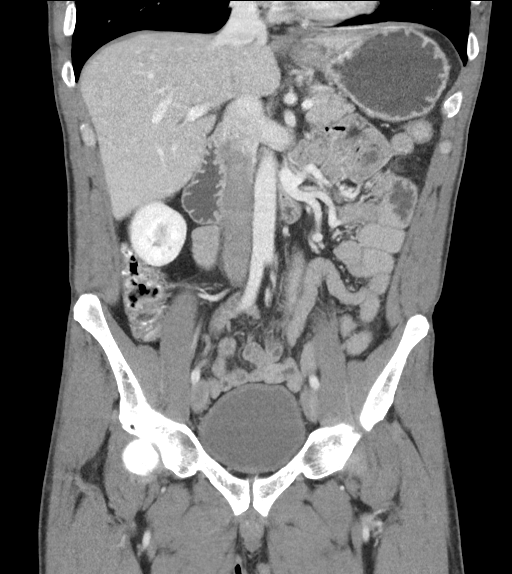
[im 43/78  soft-tissue]
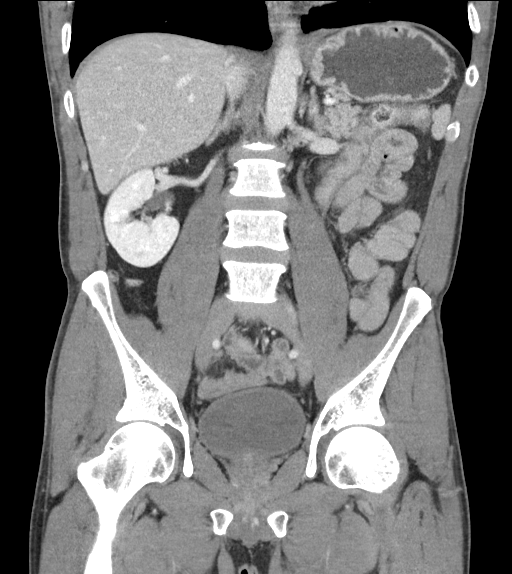

[16 of 46 positions shown; findings below may reference images not displayed]

FINDINGS: Lower chest: There is atelectatic change in the posterior right
base. Lung bases otherwise are clear.

Hepatobiliary: There is fatty infiltration near the fissure for the
ligamentum teres. There are scattered subcentimeter foci of
decreased attenuation in the liver, likely either small cysts or
hamartomas. No larger lesions are evident within the liver.
Gallbladder wall is not appreciably thickened. There is no biliary
duct dilatation.

Pancreas: There is no pancreatic mass or inflammatory focus.

Spleen: No splenic lesions are evident.

Adrenals/Urinary Tract: Adrenals appear unremarkable bilaterally.
There is a cyst in the posterior mid right kidney measuring 1.0 x
1.0 cm. A second cyst in the mid right kidney measures 7 x 7 mm.
There is a 5 mm cyst in the anterior mid left kidney. There is no
evident hydronephrosis on either side. There is no appreciable renal
or ureteral calculus on either side. Urinary bladder is midline with
wall thickness within normal limits.

Stomach/Bowel: There is no appreciable bowel wall or mesenteric
thickening. There is no evident bowel obstruction. No free air or
portal venous air.

Vascular/Lymphatic: There are foci of atherosclerotic calcification
in the aorta. There is no abdominal aortic aneurysm. Major
mesenteric vessels appear patent. There is no appreciable adenopathy
in the abdomen or pelvis.

Reproductive: Prostate and seminal vesicles appear normal in size
and contour. There is a single small calcification in the prostate.
No pelvic masses appreciable.

Other: Appendix appears normal. There is no abscess or ascites in
the abdomen or pelvis.

Musculoskeletal: There are foci of degenerative change in the lumbar
spine. There is no blastic or lytic bone lesion. There is no
intramuscular or abdominal wall lesion.
IMPRESSION: 1.  Appendix appears normal.

2.  No appreciable renal or ureteral calculus.  No hydronephrosis.

3.  No bowel obstruction.  No abscess.

4.  There are foci of atherosclerotic calcification in the aorta.

Aortic Atherosclerosis (LCNXL-YUH.H).

## 2018-03-10 DIAGNOSIS — I700 Atherosclerosis of aorta: Secondary | ICD-10-CM | POA: Diagnosis not present

## 2018-03-10 DIAGNOSIS — M5090 Cervical disc disorder, unspecified, unspecified cervical region: Secondary | ICD-10-CM | POA: Diagnosis not present

## 2018-03-10 DIAGNOSIS — F419 Anxiety disorder, unspecified: Secondary | ICD-10-CM | POA: Diagnosis not present

## 2018-03-10 DIAGNOSIS — Z1389 Encounter for screening for other disorder: Secondary | ICD-10-CM | POA: Diagnosis not present

## 2018-03-10 DIAGNOSIS — Z6822 Body mass index (BMI) 22.0-22.9, adult: Secondary | ICD-10-CM | POA: Diagnosis not present

## 2018-03-10 DIAGNOSIS — G894 Chronic pain syndrome: Secondary | ICD-10-CM | POA: Diagnosis not present

## 2018-03-19 DIAGNOSIS — Z1389 Encounter for screening for other disorder: Secondary | ICD-10-CM | POA: Diagnosis not present

## 2018-03-19 DIAGNOSIS — E0590 Thyrotoxicosis, unspecified without thyrotoxic crisis or storm: Secondary | ICD-10-CM | POA: Diagnosis not present

## 2018-03-19 LAB — LIPID PANEL
Cholesterol: 188 (ref 0–200)
HDL: 61 (ref 35–70)
LDL Cholesterol: 113
Triglycerides: 71 (ref 40–160)

## 2018-03-19 LAB — BASIC METABOLIC PANEL
Potassium: 4 (ref 3.4–5.3)
Sodium: 139 (ref 137–147)

## 2018-03-19 LAB — PSA: PSA: 0.2

## 2018-03-19 LAB — TSH: TSH: 0.12 — AB (ref 0.41–5.90)

## 2018-06-01 ENCOUNTER — Ambulatory Visit (INDEPENDENT_AMBULATORY_CARE_PROVIDER_SITE_OTHER): Payer: BLUE CROSS/BLUE SHIELD | Admitting: "Endocrinology

## 2018-06-01 ENCOUNTER — Encounter: Payer: Self-pay | Admitting: "Endocrinology

## 2018-06-01 VITALS — BP 136/85 | HR 73 | Ht 70.0 in | Wt 160.0 lb

## 2018-06-01 DIAGNOSIS — E0590 Thyrotoxicosis, unspecified without thyrotoxic crisis or storm: Secondary | ICD-10-CM | POA: Diagnosis not present

## 2018-06-01 NOTE — Progress Notes (Signed)
Endocrinology Consult Note                                            06/01/2018, 4:50 PM   Subjective:    Patient ID: Jon Campbell, male    DOB: 1960/08/08, PCP Elfredia Nevins, MD   Past Medical History:  Diagnosis Date  . Neck pain, chronic    Past Surgical History:  Procedure Laterality Date  . COLONOSCOPY WITH PROPOFOL N/A 09/19/2016   Procedure: COLONOSCOPY WITH PROPOFOL;  Surgeon: Malissa Hippo, MD;  Location: [facility];  Service: Endoscopy;  Laterality: N/A;  10:30   Social History   Socioeconomic History  . Marital status: Married    Spouse name: Not on file  . Number of children: Not on file  . Years of education: Not on file  . Highest education level: Not on file  Occupational History  . Not on file  Social Needs  . Financial resource strain: Not on file  . Food insecurity:    Worry: Not on file    Inability: Not on file  . Transportation needs:    Medical: Not on file    Non-medical: Not on file  Tobacco Use  . Smoking status: Current Every Day Smoker    Packs/day: 1.00    Years: 39.00    Pack years: 39.00    Types: Cigarettes  . Smokeless tobacco: Never Used  Substance and Sexual Activity  . Alcohol use: Not Currently  . Drug use: Yes    Types: Marijuana  . Sexual activity: Not on file  Lifestyle  . Physical activity:    Days per week: Not on file    Minutes per session: Not on file  . Stress: Not on file  Relationships  . Social connections:    Talks on phone: Not on file    Gets together: Not on file    Attends religious service: Not on file    Active member of club or organization: Not on file    Attends meetings of clubs or organizations: Not on file    Relationship status: Not on file  Other Topics Concern  . Not on file  Social History Narrative  . Not on file   Outpatient Encounter Medications as of 06/01/2018  Medication Sig  . ALPRAZolam (XANAX) 0.5 MG tablet Take 1 tablet by mouth 3 (three) times daily as  needed.  Marland Kitchen oxyCODONE-acetaminophen (PERCOCET/ROXICET) 5-325 MG per tablet Take 1 tablet by mouth. Pt takes every 2-4 hrs  . [DISCONTINUED] Aspirin-Acetaminophen-Caffeine (GOODY HEADACHE PO) Take 1 Dose by mouth as needed.  . [DISCONTINUED] cyclobenzaprine (FLEXERIL) 10 MG tablet Take 10 mg by mouth 3 (three) times daily as needed for muscle spasms.  . [DISCONTINUED] Multiple Vitamin (MULTIVITAMIN WITH MINERALS) TABS tablet Take 1 tablet by mouth daily.  . [DISCONTINUED] ondansetron (ZOFRAN ODT) 8 MG disintegrating tablet Take 1 tablet (8 mg total) by mouth every 8 (eight) hours as needed for nausea or vomiting.   No facility-administered encounter medications on file as of 06/01/2018.    ALLERGIES: No Known Allergies  VACCINATION STATUS:  There is no immunization history on file for this patient.  HPI Jon Campbell is 57 y.o. male who presents today with a medical history as above. he is being seen in consultation for suppressed TSH requested by Elfredia Nevins, MD.  Routine lab in September showed suppressed TSH, normal total T4 and total T3.  He denies recent weight change, palpitations, tremors, heat intolerance.  He has family history of goiter and his mother who underwent thyroid surgery currently on thyroid hormone replacement.  No family history of thyroid malignancy.  Patient is a chronic heavy smoker.  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 -He denies any exposure to thyroid hormone supplements.  Review of Systems  Constitutional: no weight gain/loss, no fatigue, no subjective hyperthermia, no subjective hypothermia Eyes: no blurry vision, no xerophthalmia ENT: no sore throat, no nodules palpated in throat, no dysphagia/odynophagia, no hoarseness Cardiovascular: no Chest Pain, no Shortness of Breath, no palpitations, no leg swelling Respiratory: + cough, no SOB Gastrointestinal: no Nausea/Vomiting/Diarhhea Musculoskeletal: no muscle/joint  aches Skin: no rashes Neurological: no tremors, no numbness, no tingling, no dizziness Psychiatric: no depression, no anxiety  Objective:    BP 136/85   Pulse 73   Ht 5\' 10"  (1.778 m)   Wt 160 lb (72.6 kg)   BMI 22.96 kg/m   Wt Readings from Last 3 Encounters:  06/01/18 160 lb (72.6 kg)  06/21/17 155 lb (70.3 kg)  09/19/16 155 lb (70.3 kg)    Physical Exam  Constitutional: + Appropriate weight for height, not in acute distress, normal state of mind Eyes: PERRLA, EOMI, no exophthalmos ENT: moist mucous membranes, no thyromegaly, no cervical lymphadenopathy Cardiovascular: normal precordial activity, Regular Rate and Rhythm, no Murmur/Rubs/Gallops Respiratory:  adequate breathing efforts, no gross chest deformity, Clear to auscultation bilaterally Gastrointestinal: abdomen soft, Non -tender, No distension, Bowel Sounds present Musculoskeletal: no gross deformities, strength intact in all four extremities Skin: moist, warm, no rashes Neurological: + tremor with outstretched hands, Deep tendon reflexes normal in all four extremities.  CMP ( most recent) CMP     Component Value Date/Time   NA 139 03/19/2018   K 4.0 03/19/2018   CL 96 (L) 06/21/2017 1752   CO2 30 06/21/2017 1752   GLUCOSE 104 (H) 06/21/2017 1752   BUN 19 06/21/2017 1752   CREATININE 0.84 06/21/2017 1752   CALCIUM 9.8 06/21/2017 1752   PROT 7.8 06/21/2017 1752   ALBUMIN 4.3 06/21/2017 1752   AST 25 06/21/2017 1752   ALT 20 06/21/2017 1752   ALKPHOS 51 06/21/2017 1752   BILITOT 0.6 06/21/2017 1752   GFRNONAA >60 06/21/2017 1752   GFRAA >60 06/21/2017 1752     Diabetic Labs (most recent): No results found for: HGBA1C   Lipid Panel ( most recent) Lipid Panel     Component Value Date/Time   CHOL 188 03/19/2018   TRIG 71 03/19/2018   HDL 61 03/19/2018   LDLCALC 113 03/19/2018      Lab Results  Component Value Date   TSH 0.12 (A) 03/19/2018    Total T4 5.9 normal, total T3 125  normal      Assessment & Plan:   1. Subclinical hyperthyroidism - LOY MCCARTT  is being seen at a kind request of Elfredia Nevins, MD. - I have reviewed his available thyroid records and clinically evaluated the patient. - Based on reviews, he has subclinical hyperthyroidism,  however,  there is not sufficient information to proceed with definitive treatment plan. -The chronic opioid use with potential pituitary suppression can cause secondary hypothyroidism.  This patient will need more complete, repeat and confirmatory thyroid function tests.  I approach him for repeat free T4/free T3, TSH, and antithyroid antibodies. -If his  labs are suggestive, he will need confirmatory thyroid uptake and scan before anti- thyroid treatment decisions. He will return in 10 days for reevaluation.  - I did not initiate any new prescriptions today.  He is counseled for smoking cessation.  He reports he cannot stay away from his opioids. - I advised him  to maintain close follow up with Elfredia NevinsFusco, Lawrence, MD for primary care needs.   - Time spent with the patient: 30 minutes, of which >50% was spent in obtaining information about his symptoms, reviewing his previous labs, evaluations, and treatments, counseling him about his subclinical hyperthyroidism, and developing a plan to confirm the diagnosis and long term treatment as necessary.  Orlin HildingKevin M Malecha participated in the discussions, expressed understanding, and voiced agreement with the above plans.  All questions were answered to his satisfaction. he is encouraged to contact clinic should he have any questions or concerns prior to his return visit.  Follow up plan: Return in about 10 days (around 06/11/2018) for Follow up with Pre-visit Labs.   Marquis LunchGebre Nida, MD [facility] [address] [place], [place] [address] Phone: [phone]  Fax: [phone]     06/01/2018, 4:50 PM  This note was partially  dictated with voice recognition software. Similar sounding words can be transcribed inadequately or may not  be corrected upon review.

## 2018-06-02 ENCOUNTER — Other Ambulatory Visit: Payer: Self-pay | Admitting: "Endocrinology

## 2018-06-02 DIAGNOSIS — G894 Chronic pain syndrome: Secondary | ICD-10-CM | POA: Diagnosis not present

## 2018-06-02 DIAGNOSIS — E0590 Thyrotoxicosis, unspecified without thyrotoxic crisis or storm: Secondary | ICD-10-CM | POA: Diagnosis not present

## 2018-06-02 DIAGNOSIS — Z6823 Body mass index (BMI) 23.0-23.9, adult: Secondary | ICD-10-CM | POA: Diagnosis not present

## 2018-06-02 DIAGNOSIS — Z1389 Encounter for screening for other disorder: Secondary | ICD-10-CM | POA: Diagnosis not present

## 2018-06-02 DIAGNOSIS — Z719 Counseling, unspecified: Secondary | ICD-10-CM | POA: Diagnosis not present

## 2018-06-02 DIAGNOSIS — F419 Anxiety disorder, unspecified: Secondary | ICD-10-CM | POA: Diagnosis not present

## 2018-06-02 DIAGNOSIS — F17290 Nicotine dependence, other tobacco product, uncomplicated: Secondary | ICD-10-CM | POA: Diagnosis not present

## 2018-06-04 LAB — T4, FREE: Free T4: 1 ng/dL (ref 0.82–1.77)

## 2018-06-04 LAB — THYROGLOBULIN ANTIBODY: Thyroglobulin Antibody: <1 IU/mL (ref 0.0–0.9)

## 2018-06-04 LAB — THYROID PEROXIDASE ANTIBODY: Thyroperoxidase Ab SerPl-aCnc: 14 IU/mL (ref 0–34)

## 2018-06-04 LAB — T3: T3, Total: 147 ng/dL (ref 71–180)

## 2018-06-04 LAB — TSH: TSH: 1.84 u[IU]/mL (ref 0.450–4.500)

## 2018-06-10 ENCOUNTER — Ambulatory Visit (INDEPENDENT_AMBULATORY_CARE_PROVIDER_SITE_OTHER): Payer: BLUE CROSS/BLUE SHIELD | Admitting: "Endocrinology

## 2018-06-10 ENCOUNTER — Encounter: Payer: Self-pay | Admitting: "Endocrinology

## 2018-06-10 VITALS — Ht 70.0 in | Wt 155.0 lb

## 2018-06-10 DIAGNOSIS — E0590 Thyrotoxicosis, unspecified without thyrotoxic crisis or storm: Secondary | ICD-10-CM | POA: Diagnosis not present

## 2018-06-10 NOTE — Progress Notes (Signed)
Endocrinology follow-up  Note                                            06/10/2018, 4:58 PM   Subjective:    Patient ID: Jon Campbell, male    DOB: 1960-07-14, PCP Elfredia Nevins, MD   Past Medical History:  Diagnosis Date  . Neck pain, chronic    Past Surgical History:  Procedure Laterality Date  . COLONOSCOPY WITH PROPOFOL N/A 09/19/2016   Procedure: COLONOSCOPY WITH PROPOFOL;  Surgeon: Malissa Hippo, MD;  Location: [facility];  Service: Endoscopy;  Laterality: N/A;  10:30   Social History   Socioeconomic History  . Marital status: Married    Spouse name: Not on file  . Number of children: Not on file  . Years of education: Not on file  . Highest education level: Not on file  Occupational History  . Not on file  Social Needs  . Financial resource strain: Not on file  . Food insecurity:    Worry: Not on file    Inability: Not on file  . Transportation needs:    Medical: Not on file    Non-medical: Not on file  Tobacco Use  . Smoking status: Current Every Day Smoker    Packs/day: 1.00    Years: 39.00    Pack years: 39.00    Types: Cigarettes  . Smokeless tobacco: Never Used  Substance and Sexual Activity  . Alcohol use: Not Currently  . Drug use: Yes    Types: Marijuana  . Sexual activity: Not on file  Lifestyle  . Physical activity:    Days per week: Not on file    Minutes per session: Not on file  . Stress: Not on file  Relationships  . Social connections:    Talks on phone: Not on file    Gets together: Not on file    Attends religious service: Not on file    Active member of club or organization: Not on file    Attends meetings of clubs or organizations: Not on file    Relationship status: Not on file  Other Topics Concern  . Not on file  Social History Narrative  . Not on file   Outpatient Encounter Medications as of 06/10/2018  Medication Sig  . ALPRAZolam (XANAX) 0.5 MG tablet Take 1 tablet by mouth 3 (three) times daily as  needed.  . busPIRone (BUSPAR) 10 MG tablet daily.  Marland Kitchen oxyCODONE-acetaminophen (PERCOCET/ROXICET) 5-325 MG per tablet Take 1 tablet by mouth. Pt takes every 2-4 hrs   No facility-administered encounter medications on file as of 06/10/2018.    ALLERGIES: No Known Allergies  VACCINATION STATUS:  There is no immunization history on file for this patient.  HPI Jon Campbell is 57 y.o. male who presents today with a medical history as above. he is returning with repeat thyroid function tests after he was seen in consultation for suppressed TSH consistent with subclinical hyperthyroidism.    He has no new complaints today.   -  He denies recent weight change, palpitations, tremors, heat intolerance.  He has family history of goiter in his mother who underwent thyroid surgery currently on thyroid hormone replacement.  No family history of thyroid malignancy.  Patient is a chronic heavy smoker.  He is also on heavy dose opioids related  to his cervical spine pain.  -He denies any exposure to thyroid hormone supplements.  Review of Systems  Constitutional: + Fluctuating body weight, no fatigue, no subjective hyperthermia, no subjective hypothermia Eyes: no blurry vision, no xerophthalmia ENT: no sore throat, no nodules palpated in throat, no dysphagia/odynophagia, no hoarseness Cardiovascular: no Chest Pain, no Shortness of Breath, no palpitations, no leg swelling Respiratory: + Cough, no shortness of breath  Musculoskeletal: no muscle/joint aches Skin: no rashes Neurological: no tremors, no numbness, no tingling, no dizziness Psychiatric: no depression, no anxiety  Objective:    Wt 155 lb (70.3 kg)   BMI 22.24 kg/m   Wt Readings from Last 3 Encounters:  06/10/18 155 lb (70.3 kg)  06/01/18 160 lb (72.6 kg)  06/21/17 155 lb (70.3 kg)    Physical Exam  Constitutional: + Appropriate weight for height, not in acute distress, normal state of mind Eyes: PERRLA, EOMI, no  exophthalmos ENT: moist mucous membranes, no thyromegaly, no cervical lymphadenopathy Cardiovascular: normal precordial activity, Regular Rate and Rhythm, no Murmur/Rubs/Gallops Respiratory:  adequate breathing efforts, no gross chest deformity, Clear to auscultation bilaterally Gastrointestinal: abdomen soft, Non -tender, No distension, Bowel Sounds present Musculoskeletal: no gross deformities, strength intact in all four extremities Skin: moist, warm, no rashes Neurological: + tremor with outstretched hands   CMP ( most recent) CMP     Component Value Date/Time   NA 139 03/19/2018   K 4.0 03/19/2018   CL 96 (L) 06/21/2017 1752   CO2 30 06/21/2017 1752   GLUCOSE 104 (H) 06/21/2017 1752   BUN 19 06/21/2017 1752   CREATININE 0.84 06/21/2017 1752   CALCIUM 9.8 06/21/2017 1752   PROT 7.8 06/21/2017 1752   ALBUMIN 4.3 06/21/2017 1752   AST 25 06/21/2017 1752   ALT 20 06/21/2017 1752   ALKPHOS 51 06/21/2017 1752   BILITOT 0.6 06/21/2017 1752   GFRNONAA >60 06/21/2017 1752   GFRAA >60 06/21/2017 1752     Diabetic Labs (most recent): No results found for: HGBA1C   Lipid Panel ( most recent) Lipid Panel     Component Value Date/Time   CHOL 188 03/19/2018   TRIG 71 03/19/2018   HDL 61 03/19/2018   LDLCALC 113 03/19/2018      Lab Results  Component Value Date   TSH 1.840 06/02/2018   TSH 0.12 (A) 03/19/2018   FREET4 1.00 06/02/2018    Total T4 5.9 normal, total T3 125  normal     Assessment & Plan:   1. Subclinical hyperthyroidism-resolved -He is more complete full profile thyroid function tests are all within normal limits.  He will not require any intervention with thyroid hormone nor antithyroid medications at this time. He will need free T4/TSH measured in 1 year with office visit if needed.  -By far his most serious health problem is his heavy smoking as well as heavy opioids which he says he cannot stay away from.  He is counseled for smoking cessation. -  I did not initiate any new prescriptions today.    - I advised him  to maintain close follow up with Elfredia NevinsFusco, Lawrence, MD for primary care needs.  Follow up plan: Return in about 1 year (around 06/11/2019), or if symptoms worsen or fail to improve, for Follow up with Pre-visit Labs.   Marquis LunchGebre Annistyn Depass, MD [facility] [address] [place], [place] [address] Phone: [phone]  Fax: [phone]     06/10/2018, 4:58 PM  This note was  partially dictated with voice recognition software. Similar sounding words can be transcribed inadequately or may not  be corrected upon review.

## 2018-06-23 DIAGNOSIS — G894 Chronic pain syndrome: Secondary | ICD-10-CM | POA: Diagnosis not present

## 2018-06-23 DIAGNOSIS — Z1389 Encounter for screening for other disorder: Secondary | ICD-10-CM | POA: Diagnosis not present

## 2018-06-23 DIAGNOSIS — Z6823 Body mass index (BMI) 23.0-23.9, adult: Secondary | ICD-10-CM | POA: Diagnosis not present

## 2018-06-23 DIAGNOSIS — M542 Cervicalgia: Secondary | ICD-10-CM | POA: Diagnosis not present

## 2018-07-27 DIAGNOSIS — Z1389 Encounter for screening for other disorder: Secondary | ICD-10-CM | POA: Diagnosis not present

## 2018-07-27 DIAGNOSIS — M542 Cervicalgia: Secondary | ICD-10-CM | POA: Diagnosis not present

## 2018-07-27 DIAGNOSIS — G894 Chronic pain syndrome: Secondary | ICD-10-CM | POA: Diagnosis not present

## 2018-07-27 DIAGNOSIS — Z6824 Body mass index (BMI) 24.0-24.9, adult: Secondary | ICD-10-CM | POA: Diagnosis not present

## 2018-08-10 DIAGNOSIS — M5030 Other cervical disc degeneration, unspecified cervical region: Secondary | ICD-10-CM | POA: Diagnosis not present

## 2018-08-10 DIAGNOSIS — G894 Chronic pain syndrome: Secondary | ICD-10-CM | POA: Diagnosis not present

## 2018-08-10 DIAGNOSIS — Z6822 Body mass index (BMI) 22.0-22.9, adult: Secondary | ICD-10-CM | POA: Diagnosis not present

## 2018-09-20 DIAGNOSIS — M5030 Other cervical disc degeneration, unspecified cervical region: Secondary | ICD-10-CM | POA: Diagnosis not present

## 2018-09-20 DIAGNOSIS — Z6824 Body mass index (BMI) 24.0-24.9, adult: Secondary | ICD-10-CM | POA: Diagnosis not present

## 2018-09-20 DIAGNOSIS — Z23 Encounter for immunization: Secondary | ICD-10-CM | POA: Diagnosis not present

## 2018-09-20 DIAGNOSIS — Z1389 Encounter for screening for other disorder: Secondary | ICD-10-CM | POA: Diagnosis not present

## 2018-09-20 DIAGNOSIS — G894 Chronic pain syndrome: Secondary | ICD-10-CM | POA: Diagnosis not present

## 2018-10-06 DIAGNOSIS — R509 Fever, unspecified: Secondary | ICD-10-CM | POA: Diagnosis not present

## 2018-11-16 DIAGNOSIS — M5030 Other cervical disc degeneration, unspecified cervical region: Secondary | ICD-10-CM | POA: Diagnosis not present

## 2018-11-16 DIAGNOSIS — Z1389 Encounter for screening for other disorder: Secondary | ICD-10-CM | POA: Diagnosis not present

## 2018-11-16 DIAGNOSIS — G894 Chronic pain syndrome: Secondary | ICD-10-CM | POA: Diagnosis not present

## 2018-11-16 DIAGNOSIS — Z6824 Body mass index (BMI) 24.0-24.9, adult: Secondary | ICD-10-CM | POA: Diagnosis not present

## 2018-11-19 DIAGNOSIS — Z23 Encounter for immunization: Secondary | ICD-10-CM | POA: Diagnosis not present

## 2019-01-10 DIAGNOSIS — Z681 Body mass index (BMI) 19 or less, adult: Secondary | ICD-10-CM | POA: Diagnosis not present

## 2019-01-10 DIAGNOSIS — G894 Chronic pain syndrome: Secondary | ICD-10-CM | POA: Diagnosis not present

## 2019-01-10 DIAGNOSIS — Z1389 Encounter for screening for other disorder: Secondary | ICD-10-CM | POA: Diagnosis not present

## 2019-02-10 DIAGNOSIS — G894 Chronic pain syndrome: Secondary | ICD-10-CM | POA: Diagnosis not present

## 2019-03-10 DIAGNOSIS — G894 Chronic pain syndrome: Secondary | ICD-10-CM | POA: Diagnosis not present

## 2019-04-06 DIAGNOSIS — G894 Chronic pain syndrome: Secondary | ICD-10-CM | POA: Diagnosis not present

## 2019-04-06 DIAGNOSIS — Z6823 Body mass index (BMI) 23.0-23.9, adult: Secondary | ICD-10-CM | POA: Diagnosis not present

## 2019-04-06 DIAGNOSIS — M5030 Other cervical disc degeneration, unspecified cervical region: Secondary | ICD-10-CM | POA: Diagnosis not present

## 2019-04-06 DIAGNOSIS — Z23 Encounter for immunization: Secondary | ICD-10-CM | POA: Diagnosis not present

## 2019-05-05 DIAGNOSIS — G894 Chronic pain syndrome: Secondary | ICD-10-CM | POA: Diagnosis not present

## 2019-06-01 DIAGNOSIS — G894 Chronic pain syndrome: Secondary | ICD-10-CM | POA: Diagnosis not present

## 2019-06-01 DIAGNOSIS — M5030 Other cervical disc degeneration, unspecified cervical region: Secondary | ICD-10-CM | POA: Diagnosis not present

## 2019-06-29 DIAGNOSIS — G894 Chronic pain syndrome: Secondary | ICD-10-CM | POA: Diagnosis not present

## 2019-06-29 DIAGNOSIS — M5030 Other cervical disc degeneration, unspecified cervical region: Secondary | ICD-10-CM | POA: Diagnosis not present

## 2019-07-28 DIAGNOSIS — G894 Chronic pain syndrome: Secondary | ICD-10-CM | POA: Diagnosis not present

## 2019-08-23 DIAGNOSIS — Z6823 Body mass index (BMI) 23.0-23.9, adult: Secondary | ICD-10-CM | POA: Diagnosis not present

## 2019-08-23 DIAGNOSIS — G894 Chronic pain syndrome: Secondary | ICD-10-CM | POA: Diagnosis not present

## 2019-08-23 DIAGNOSIS — M5030 Other cervical disc degeneration, unspecified cervical region: Secondary | ICD-10-CM | POA: Diagnosis not present

## 2019-09-15 DIAGNOSIS — G894 Chronic pain syndrome: Secondary | ICD-10-CM | POA: Diagnosis not present

## 2019-09-15 DIAGNOSIS — M5030 Other cervical disc degeneration, unspecified cervical region: Secondary | ICD-10-CM | POA: Diagnosis not present

## 2019-10-13 DIAGNOSIS — M5030 Other cervical disc degeneration, unspecified cervical region: Secondary | ICD-10-CM | POA: Diagnosis not present

## 2019-10-13 DIAGNOSIS — G894 Chronic pain syndrome: Secondary | ICD-10-CM | POA: Diagnosis not present

## 2019-11-10 DIAGNOSIS — G894 Chronic pain syndrome: Secondary | ICD-10-CM | POA: Diagnosis not present

## 2019-11-10 DIAGNOSIS — M47812 Spondylosis without myelopathy or radiculopathy, cervical region: Secondary | ICD-10-CM | POA: Diagnosis not present

## 2019-11-10 DIAGNOSIS — M5412 Radiculopathy, cervical region: Secondary | ICD-10-CM | POA: Diagnosis not present

## 2019-12-08 DIAGNOSIS — M5030 Other cervical disc degeneration, unspecified cervical region: Secondary | ICD-10-CM | POA: Diagnosis not present

## 2019-12-08 DIAGNOSIS — G894 Chronic pain syndrome: Secondary | ICD-10-CM | POA: Diagnosis not present

## 2020-01-04 DIAGNOSIS — G894 Chronic pain syndrome: Secondary | ICD-10-CM | POA: Diagnosis not present

## 2020-01-04 DIAGNOSIS — M5030 Other cervical disc degeneration, unspecified cervical region: Secondary | ICD-10-CM | POA: Diagnosis not present

## 2020-02-06 DIAGNOSIS — G894 Chronic pain syndrome: Secondary | ICD-10-CM | POA: Diagnosis not present

## 2020-03-14 DIAGNOSIS — G894 Chronic pain syndrome: Secondary | ICD-10-CM | POA: Diagnosis not present

## 2020-04-12 DIAGNOSIS — Z23 Encounter for immunization: Secondary | ICD-10-CM | POA: Diagnosis not present

## 2020-04-12 DIAGNOSIS — G894 Chronic pain syndrome: Secondary | ICD-10-CM | POA: Diagnosis not present

## 2020-04-12 DIAGNOSIS — Z6824 Body mass index (BMI) 24.0-24.9, adult: Secondary | ICD-10-CM | POA: Diagnosis not present

## 2020-04-12 DIAGNOSIS — M5030 Other cervical disc degeneration, unspecified cervical region: Secondary | ICD-10-CM | POA: Diagnosis not present

## 2020-04-12 DIAGNOSIS — Z1331 Encounter for screening for depression: Secondary | ICD-10-CM | POA: Diagnosis not present

## 2020-04-12 DIAGNOSIS — N529 Male erectile dysfunction, unspecified: Secondary | ICD-10-CM | POA: Diagnosis not present

## 2020-05-10 DIAGNOSIS — Z6823 Body mass index (BMI) 23.0-23.9, adult: Secondary | ICD-10-CM | POA: Diagnosis not present

## 2020-05-10 DIAGNOSIS — Z0000 Encounter for general adult medical examination without abnormal findings: Secondary | ICD-10-CM | POA: Diagnosis not present

## 2020-05-10 DIAGNOSIS — G894 Chronic pain syndrome: Secondary | ICD-10-CM | POA: Diagnosis not present

## 2020-05-10 DIAGNOSIS — M5030 Other cervical disc degeneration, unspecified cervical region: Secondary | ICD-10-CM | POA: Diagnosis not present

## 2020-05-28 DIAGNOSIS — M5030 Other cervical disc degeneration, unspecified cervical region: Secondary | ICD-10-CM | POA: Diagnosis not present

## 2020-05-28 DIAGNOSIS — G894 Chronic pain syndrome: Secondary | ICD-10-CM | POA: Diagnosis not present

## 2020-05-28 DIAGNOSIS — Z6823 Body mass index (BMI) 23.0-23.9, adult: Secondary | ICD-10-CM | POA: Diagnosis not present

## 2020-06-26 DIAGNOSIS — G894 Chronic pain syndrome: Secondary | ICD-10-CM | POA: Diagnosis not present

## 2020-06-26 DIAGNOSIS — M5030 Other cervical disc degeneration, unspecified cervical region: Secondary | ICD-10-CM | POA: Diagnosis not present

## 2020-07-17 DIAGNOSIS — K529 Noninfective gastroenteritis and colitis, unspecified: Secondary | ICD-10-CM | POA: Diagnosis not present

## 2020-07-26 DIAGNOSIS — G894 Chronic pain syndrome: Secondary | ICD-10-CM | POA: Diagnosis not present

## 2020-07-26 DIAGNOSIS — M5030 Other cervical disc degeneration, unspecified cervical region: Secondary | ICD-10-CM | POA: Diagnosis not present

## 2020-08-29 DIAGNOSIS — G894 Chronic pain syndrome: Secondary | ICD-10-CM | POA: Diagnosis not present

## 2020-08-29 DIAGNOSIS — Z6824 Body mass index (BMI) 24.0-24.9, adult: Secondary | ICD-10-CM | POA: Diagnosis not present

## 2020-08-29 DIAGNOSIS — M5030 Other cervical disc degeneration, unspecified cervical region: Secondary | ICD-10-CM | POA: Diagnosis not present

## 2020-09-27 DIAGNOSIS — M5030 Other cervical disc degeneration, unspecified cervical region: Secondary | ICD-10-CM | POA: Diagnosis not present

## 2020-09-27 DIAGNOSIS — G894 Chronic pain syndrome: Secondary | ICD-10-CM | POA: Diagnosis not present

## 2020-10-25 DIAGNOSIS — M5030 Other cervical disc degeneration, unspecified cervical region: Secondary | ICD-10-CM | POA: Diagnosis not present

## 2020-10-25 DIAGNOSIS — M5020 Other cervical disc displacement, unspecified cervical region: Secondary | ICD-10-CM | POA: Diagnosis not present

## 2020-10-25 DIAGNOSIS — G894 Chronic pain syndrome: Secondary | ICD-10-CM | POA: Diagnosis not present

## 2020-11-26 DIAGNOSIS — Z6822 Body mass index (BMI) 22.0-22.9, adult: Secondary | ICD-10-CM | POA: Diagnosis not present

## 2020-11-26 DIAGNOSIS — F419 Anxiety disorder, unspecified: Secondary | ICD-10-CM | POA: Diagnosis not present

## 2020-11-26 DIAGNOSIS — G894 Chronic pain syndrome: Secondary | ICD-10-CM | POA: Diagnosis not present

## 2020-12-27 DIAGNOSIS — G894 Chronic pain syndrome: Secondary | ICD-10-CM | POA: Diagnosis not present

## 2020-12-27 DIAGNOSIS — M5030 Other cervical disc degeneration, unspecified cervical region: Secondary | ICD-10-CM | POA: Diagnosis not present

## 2021-01-11 ENCOUNTER — Emergency Department (HOSPITAL_COMMUNITY): Payer: BC Managed Care – PPO

## 2021-01-11 ENCOUNTER — Emergency Department (HOSPITAL_COMMUNITY)
Admission: EM | Admit: 2021-01-11 | Discharge: 2021-01-12 | Disposition: A | Discharge status: Home / Self Care | Payer: BC Managed Care – PPO | Attending: Emergency Medicine | Admitting: Emergency Medicine

## 2021-01-11 ENCOUNTER — Encounter (HOSPITAL_COMMUNITY): Payer: Self-pay

## 2021-01-11 ENCOUNTER — Other Ambulatory Visit: Payer: Self-pay

## 2021-01-11 DIAGNOSIS — M79672 Pain in left foot: Secondary | ICD-10-CM | POA: Diagnosis not present

## 2021-01-11 DIAGNOSIS — M25572 Pain in left ankle and joints of left foot: Secondary | ICD-10-CM | POA: Diagnosis not present

## 2021-01-11 DIAGNOSIS — W1789XA Other fall from one level to another, initial encounter: Secondary | ICD-10-CM | POA: Diagnosis not present

## 2021-01-11 DIAGNOSIS — F17210 Nicotine dependence, cigarettes, uncomplicated: Secondary | ICD-10-CM | POA: Diagnosis not present

## 2021-01-11 DIAGNOSIS — S92002A Unspecified fracture of left calcaneus, initial encounter for closed fracture: Secondary | ICD-10-CM

## 2021-01-11 DIAGNOSIS — M25472 Effusion, left ankle: Secondary | ICD-10-CM | POA: Diagnosis not present

## 2021-01-11 MED ORDER — HYDROMORPHONE HCL 1 MG/ML IJ SOLN
1.0000 mg | Freq: Once | INTRAMUSCULAR | Status: AC
  Administered 2021-01-11: 1 mg via INTRAVENOUS
  Filled 2021-01-11: qty 1

## 2021-01-11 MED ORDER — ONDANSETRON HCL 4 MG/2ML IJ SOLN
4.0000 mg | Freq: Once | INTRAMUSCULAR | Status: AC
Start: 2021-01-11 — End: 2021-01-11
  Administered 2021-01-11: 4 mg via INTRAVENOUS
  Filled 2021-01-11: qty 2

## 2021-01-11 MED ORDER — MORPHINE SULFATE (PF) 4 MG/ML IV SOLN
4.0000 mg | Freq: Once | INTRAVENOUS | Status: AC
  Administered 2021-01-11: 4 mg via INTRAVENOUS
  Filled 2021-01-11: qty 1

## 2021-01-11 NOTE — ED Provider Notes (Signed)
[facility] EMERGENCY DEPARTMENT Provider Note   CSN: [id number] Arrival date & time: 01/11/21  2037     History Chief Complaint  Patient presents with   Foot Pain    Jon Campbell is a 60 y.o. male.  Pt said he fell about 10 feet off a roof.  He said the roof was metal; it started to rain; he slipped.  Pt landed on both feet, but bore the brunt of his weight on his left foot.  He has a lot of pain in the foot.  No back pain.  He denies hitting his head or loc.  He is not on blood thinners.      Past Medical History:  Diagnosis Date   Neck pain, chronic     Patient Active Problem List   Diagnosis Date Noted   Subclinical hyperthyroidism 06/01/2018   Special screening for malignant neoplasms, colon 08/18/2016    Past Surgical History:  Procedure Laterality Date   COLONOSCOPY WITH PROPOFOL N/A 09/19/2016   Procedure: COLONOSCOPY WITH PROPOFOL;  Surgeon: Malissa Hippo, MD;  [facility]: [facility];  Service: Endoscopy;  Laterality: N/A;  10:30       Family History  Problem Relation Age of Onset   Hypertension Mother    Hypertension Father     Social History   Tobacco Use   Smoking status: Every Day    Packs/day: 1.00    Years: 39.00    Pack years: 39.00    Types: Cigarettes   Smokeless tobacco: Never  Vaping Use   Vaping Use: Never used  Substance Use Topics   Alcohol use: Not Currently   Drug use: Yes    Types: Marijuana    Home Medications Prior to Admission medications   Medication Sig Start Date End Date Taking? Authorizing Provider  ALPRAZolam Prudy Feeler) 0.5 MG tablet Take 1 tablet by mouth 3 (three) times daily as needed. 08/07/16   [provider]  busPIRone (BUSPAR) 10 MG tablet daily. 06/02/18   [provider]  oxyCODONE-acetaminophen (PERCOCET/ROXICET) 5-325 MG per tablet Take 1 tablet by mouth. Pt takes every 2-4 hrs    [provider]    Allergies    Patient has no known allergies.  Review of Systems    Review of Systems  Musculoskeletal:        Left foot pain   Physical Exam Updated Vital Signs BP (!) 146/87 (BP Location: Right Arm)   Pulse (!) 115   Temp 98.5 F (36.9 C) (Oral)   Resp 20   Ht 5\' 10"  (1.778 m)   Wt 72.6 kg   SpO2 96%   BMI 22.96 kg/m   Physical Exam Vitals and nursing note reviewed.  Constitutional:      Appearance: Normal appearance.  HENT:     Head: Normocephalic and atraumatic.     Right Ear: External ear normal.     Left Ear: External ear normal.     Nose: Nose normal.     Mouth/Throat:     Mouth: Mucous membranes are moist.     Pharynx: Oropharynx is clear.  Eyes:     Extraocular Movements: Extraocular movements intact.     Conjunctiva/sclera: Conjunctivae normal.     Pupils: Pupils are equal, round, and reactive to light.  Cardiovascular:     Rate and Rhythm: Normal rate and regular rhythm.     Pulses: Normal pulses.     Heart sounds: Normal heart sounds.  Pulmonary:  Effort: Pulmonary effort is normal.     Breath sounds: Normal breath sounds.  Abdominal:     General: Abdomen is flat. Bowel sounds are normal.     Palpations: Abdomen is soft.  Musculoskeletal:     Cervical back: Normal range of motion and neck supple.     Comments: Left foot swollen and tender to calcaneus  Skin:    General: Skin is warm.     Capillary Refill: Capillary refill takes less than 2 seconds.  Neurological:     General: No focal deficit present.     Mental Status: He is alert and oriented to person, place, and time.  Psychiatric:        Mood and Affect: Mood normal.        Behavior: Behavior normal.    ED Results / Procedures / Treatments   Labs (all labs ordered are listed, but only abnormal results are displayed) Labs Reviewed - No data to display  EKG None  Radiology DG Ankle Complete Left  Result Date: 01/11/2021 CLINICAL DATA:  Fall with ankle pain EXAM: LEFT ANKLE COMPLETE - 3+ VIEW COMPARISON:  None. FINDINGS: Ankle mortise is symmetric.  Possible calcaneus fracture on mortise view. IMPRESSION: 1. Possible calcaneus fracture.  Suggest CT for further evaluation. Electronically Signed   By: Jasmine Pang M.D.   On: 01/11/2021 21:19    Procedures Procedures   Medications Ordered in ED Medications  morphine 4 MG/ML injection 4 mg (4 mg Intravenous Given 01/11/21 2142)  ondansetron (ZOFRAN) injection 4 mg (4 mg Intravenous Given 01/11/21 2141)  HYDROmorphone (DILAUDID) injection 1 mg (1 mg Intravenous Given 01/11/21 2215)    ED Course  I have reviewed the triage vital signs and the nursing notes.  Pertinent labs & imaging results that were available during my care of the patient were reviewed by me and considered in my medical decision making (see chart for details).    MDM Rules/Calculators/A&P                          Pt likely has a calcaneal fracture.  CT recommended.  This is pending at shift change.  Pt signed out to Dr. Clayborne Dana.  Final Clinical Impression(s) / ED Diagnoses Final diagnoses:  Foot pain, left    Rx / DC Orders ED Discharge Orders     None        Jacalyn Lefevre, MD 01/11/21 [phone]

## 2021-01-11 NOTE — ED Triage Notes (Signed)
Pt arrived via POV c/o left foot pain after falling apprx 21ft off a metal roof today. Pt reports landing on the grass on both feet but thinks he may have broke his left ankle. Swelling and bruising noted in Triage on left lateral aspect of ankle.

## 2021-01-12 ENCOUNTER — Emergency Department (HOSPITAL_COMMUNITY): Payer: BC Managed Care – PPO

## 2021-01-12 DIAGNOSIS — M25472 Effusion, left ankle: Secondary | ICD-10-CM | POA: Diagnosis not present

## 2021-01-12 DIAGNOSIS — S92012A Displaced fracture of body of left calcaneus, initial encounter for closed fracture: Secondary | ICD-10-CM | POA: Diagnosis not present

## 2021-01-12 DIAGNOSIS — S92002A Unspecified fracture of left calcaneus, initial encounter for closed fracture: Secondary | ICD-10-CM | POA: Diagnosis not present

## 2021-01-12 MED ORDER — OXYCODONE-ACETAMINOPHEN 5-325 MG PO TABS
2.0000 | ORAL_TABLET | Freq: Once | ORAL | Status: AC
  Administered 2021-01-12: 2 via ORAL
  Filled 2021-01-12: qty 2

## 2021-01-12 MED ORDER — MELOXICAM 15 MG PO TABS: 15.0000 mg | ORAL_TABLET | Freq: Every day | ORAL | 1 refills | Status: AC

## 2021-01-12 MED ORDER — MELOXICAM 15 MG PO TABS
15.0000 mg | ORAL_TABLET | Freq: Once | ORAL | Status: DC
  Filled 2021-01-12: qty 1

## 2021-01-12 NOTE — ED Provider Notes (Signed)
1:52 AM Assumed care from Dr. Particia Nearing, please see their note for full history, physical and decision making until this point. In brief this is a 60 y.o. year old male who presented to the ED tonight with Foot Pain     Xr w/ question of calcaneal fracture, pending CT to confirm.   Ct confirmed. Complex. On high dose opiate regimen at home, will d/w PCP on increasing for this acute issue. otherwise follow up with Dr. Dallas Schimke for same. Crutches/cam walker provided. NWB as much as possible.   Discharge instructions, including strict return precautions for new or worsening symptoms, given. Patient and/or family verbalized understanding and agreement with the plan as described.   Labs, studies and imaging reviewed by myself and considered in medical decision making if ordered. Imaging interpreted by radiology.  Labs Reviewed - No data to display  CT Ankle Left Wo Contrast  Final Result    DG Ankle Complete Left  Final Result      No follow-ups on file.    Brihany Butch, Barbara Cower, MD 01/12/21 [phone]

## 2021-01-16 ENCOUNTER — Encounter: Payer: Self-pay | Admitting: Orthopedic Surgery

## 2021-01-16 ENCOUNTER — Other Ambulatory Visit: Payer: Self-pay

## 2021-01-16 ENCOUNTER — Ambulatory Visit (INDEPENDENT_AMBULATORY_CARE_PROVIDER_SITE_OTHER): Payer: BC Managed Care – PPO | Admitting: Orthopedic Surgery

## 2021-01-16 VITALS — BP 122/74 | HR 80 | Ht 70.0 in | Wt 159.6 lb

## 2021-01-16 DIAGNOSIS — S92064A Nondisplaced intraarticular fracture of right calcaneus, initial encounter for closed fracture: Secondary | ICD-10-CM

## 2021-01-16 DIAGNOSIS — S92065A Nondisplaced intraarticular fracture of left calcaneus, initial encounter for closed fracture: Secondary | ICD-10-CM

## 2021-01-16 MED ORDER — ASPIRIN EC 81 MG PO TBEC: 81.0000 mg | DELAYED_RELEASE_TABLET | Freq: Two times a day (BID) | ORAL | 0 refills | Status: AC

## 2021-01-16 NOTE — Patient Instructions (Signed)

## 2021-01-16 NOTE — Progress Notes (Signed)
New Patient Visit  Assessment: Jon Campbell is a 60 y.o. male with the following: 1. Closed nondisplaced intra-articular fracture of right calcaneus, initial encounter  Plan: Jon Campbell sustained a comminuted, but minimally displaced left calcaneus fracture after falling off a roof.  He was given a walking boot in the emergency department, but has not been wearing it as it does not fit well.  His left foot is swollen, albeit less than I expected.  Nonetheless, we will place him in a short leg cast, and keep him nonweightbearing.  Despite the comminution, the joint line remains intact, and I think that this can be treated without surgery.  We will plan to see him back in 2 weeks for repeat evaluation, and transition into another cast.  Given his limited mobility, I have provided him with a prescription for aspirin, to limit his risk for a DVT.  He was given a note for work, excusing him until he can be full weightbearing.  All questions were answered, and he is amenable this plan.  Cast application -left short leg cast   Verbal consent was obtained and the correct extremity was identified. A well padded, appropriately molded short leg cast was applied to the left leg  Toes remained warm and well perfused.   There were no sharp edges Patient tolerated the procedure well Cast care instructions were provided     Follow-up: Return in about 2 weeks (around 01/30/2021).  Subjective:  Chief Complaint  Patient presents with   New Patient (Initial Visit)    Fx l calcaneus/ fell off wet metal roof 12/12/20    History of Present Illness: Jon Campbell is a 61 y.o. male who presents for evaluation of a left foot injury.  He was doing some work on a roof, a couple of days ago, with a cane slipped due to rain.  He subsequently slipped off the roof, and fell.  There was approximately 12 feet.  He stated that he landed on both feet, but his left foot specifically landed on a cinder block.  He  presented to the emergency department, where x-rays and a CT scan demonstrated a calcaneus fracture.  He is given a walking boot, and has remained nonweightbearing.  He is not wearing a walking boot currently.  He is using crutches.  Pain is well controlled with elevation and meloxicam.  He does take narcotics for chronic pain issues.  No other injuries were noted.   Review of Systems: No fevers or chills No numbness or tingling No chest pain No shortness of breath No bowel or bladder dysfunction No GI distress No headaches   Medical History:  Past Medical History:  Diagnosis Date   Neck pain, chronic     Past Surgical History:  Procedure Laterality Date   COLONOSCOPY WITH PROPOFOL N/A 09/19/2016   Procedure: COLONOSCOPY WITH PROPOFOL;  Surgeon: Malissa Hippo, MD;  Location: [facility];  Service: Endoscopy;  Laterality: N/A;  10:30    Family History  Problem Relation Age of Onset   Hypertension Mother    Hypertension Father    Social History   Tobacco Use   Smoking status: Every Day    Packs/day: 1.00    Years: 39.00    Pack years: 39.00    Types: Cigarettes   Smokeless tobacco: Never  Vaping Use   Vaping Use: Never used  Substance Use Topics   Alcohol use: Not Currently   Drug use: Yes    Types: Marijuana  No Known Allergies  Current Meds  Medication Sig   aspirin EC 81 MG tablet Take 1 tablet (81 mg total) by mouth in the morning and at bedtime. Swallow whole.   celecoxib (CELEBREX) 200 MG capsule Take 200 mg by mouth daily.   HYDROmorphone (DILAUDID) 4 MG tablet Take 4 mg by mouth every 4 (four) hours as needed.   meloxicam (MOBIC) 15 MG tablet Take 1 tablet (15 mg total) by mouth daily.   morphine (MS CONTIN) 15 MG 12 hr tablet Take 15 mg by mouth in the morning and at bedtime.    Objective: BP 122/74   Pulse 80   Ht 5\' 10"  (1.778 m)   Wt 159 lb 9.6 oz (72.4 kg)   BMI 22.90 kg/m   Physical Exam:  General: Alert and oriented. and No acute  distress. Gait: Ambulates with the assistance of crutches.  Evaluation of the left foot demonstrates diffuse swelling about the ankle and the foot.  Some ecchymosis is present on the plantar aspect of his foot.  Active dorsiflexion of the ankle and the great toe.  Sensation is intact to the dorsum of the foot.  Tenderness palpation in the posterior foot.  Toes are warm and well perfused.  2+ DP pulse.   IMAGING: I personally reviewed images previously obtained from the ED  X-ray of the left foot demonstrates a minimally displaced calcaneus fracture.  Calcaneal height is maintained.  No obvious step-offs within the joint.  CT scan of the left foot demonstrates a comminuted, but minimally displaced calcaneus fracture.  Intra-articular portions remain in good alignment.  There are no articular step-offs.  New Medications:  Meds ordered this encounter  Medications   aspirin EC 81 MG tablet    Sig: Take 1 tablet (81 mg total) by mouth in the morning and at bedtime. Swallow whole.    Dispense:  84 tablet    Refill:  0      , MD  01/16/2021 9:47 AM

## 2021-01-18 ENCOUNTER — Ambulatory Visit: Payer: BC Managed Care – PPO | Admitting: Orthopedic Surgery

## 2021-01-21 DIAGNOSIS — G894 Chronic pain syndrome: Secondary | ICD-10-CM | POA: Diagnosis not present

## 2021-01-21 DIAGNOSIS — Z681 Body mass index (BMI) 19 or less, adult: Secondary | ICD-10-CM | POA: Diagnosis not present

## 2021-01-30 ENCOUNTER — Ambulatory Visit (INDEPENDENT_AMBULATORY_CARE_PROVIDER_SITE_OTHER): Payer: BC Managed Care – PPO | Admitting: Orthopedic Surgery

## 2021-01-30 ENCOUNTER — Encounter: Payer: Self-pay | Admitting: Orthopedic Surgery

## 2021-01-30 ENCOUNTER — Ambulatory Visit: Payer: BC Managed Care – PPO

## 2021-01-30 ENCOUNTER — Other Ambulatory Visit: Payer: Self-pay

## 2021-01-30 VITALS — Ht 70.0 in | Wt 159.0 lb

## 2021-01-30 DIAGNOSIS — S92064D Nondisplaced intraarticular fracture of right calcaneus, subsequent encounter for fracture with routine healing: Secondary | ICD-10-CM

## 2021-01-30 DIAGNOSIS — S92065D Nondisplaced intraarticular fracture of left calcaneus, subsequent encounter for fracture with routine healing: Secondary | ICD-10-CM

## 2021-01-30 NOTE — Patient Instructions (Signed)
General Cast Instructions  1.  You were placed in a cast in clinic today.  Please keep the cast material clean, dry and intact.  Please do not use anything to itch the under the cast.  If it gets itchy, you can consider taking benadryl, or similar medication.  If the cast material gets wet, place it on a towel and use a hair dryer on a low setting. 2.  Tylenol or Ibuprofen/Naproxen as needed.   3.  Recommend elevating your extremity as much as possible to help with swelling. 4.  F/u 3 weeks, cast off and repeat XR  

## 2021-01-30 NOTE — Progress Notes (Signed)
Orthopaedic Clinic Return  Assessment: Jon Campbell is a 60 y.o. male with the following: Comminuted left calcaneus fracture, and stable alignment; continue nonoperative management  Plan: Patient continues to do well following the injury.  He has remained nonweightbearing in the cast.  He has tolerated the cast without issues.  Repeat radiographs today demonstrate stable alignment.  There is been no interval displacement.  We will continue with nonoperative management and transition him into another cast today.  He is to remain nonweightbearing.  Elevate the extremity is much as possible to improve swelling.  Follow-up in 2-3 weeks for repeat evaluation.  Cast application -left short leg cast   Verbal consent was obtained and the correct extremity was identified. A well padded, appropriately molded short leg cast was applied to the left leg  Toes remained warm and well perfused.   There were no sharp edges Patient tolerated the procedure well Cast care instructions were provided    Follow-up: Return in about 20 days (around 02/19/2021).   Subjective:  Chief Complaint  Patient presents with   Fracture    Lt calcaneus fx. DOI 01/11/21    History of Present Illness: Jon Campbell is a 60 y.o. male who returns to clinic for repeat evaluation of his left heel.  Briefly, he fell off a roof approximately 3 weeks ago and sustained a comminuted left calcaneus fracture.  It has remained in stable alignment, and we have elected to proceed with nonoperative management.  He returns today for repeat evaluation, and cast change.  He continues to do well.  He is remained nonweightbearing.  He is tolerated the cast.  No numbness or tingling.  No increased requirement for pain medications.  Review of Systems: No fevers or chills No numbness or tingling No chest pain No shortness of breath No bowel or bladder dysfunction No GI distress No headaches  Objective: Ht 5\' 10"  (1.778 m)   Wt  159 lb (72.1 kg)   BMI 22.81 kg/m   Physical Exam:  Alert and oriented.  No acute distress.  Walks with the assistance of crutches.  Upon removal of cast, there is no skin breakdown.  No rashes are appreciated.  Swelling has improved, but he still has some residual swelling.  Mild amount of ecchymosis in the dependent aspect of his foot.  Tenderness to palpation on the plantar aspect of his heel.  Toes are warm and well perfused.  Active dorsiflexion of his ankle and great toe is tolerated.  2+ DP pulse.  IMAGING: I personally ordered and reviewed the following images:  X-rays of the left foot were obtained in clinic today and demonstrates a minimally displaced, comminuted fracture of the calcaneus.  No obvious intra-articular step-off.  Calcaneal height is maintained.  No interval displacement of the fractures.  Impression: Comminuted left calcaneus fracture in stable alignment.   , MD 01/30/2021 9:10 PM

## 2021-02-14 DIAGNOSIS — G894 Chronic pain syndrome: Secondary | ICD-10-CM | POA: Diagnosis not present

## 2021-02-14 DIAGNOSIS — M5030 Other cervical disc degeneration, unspecified cervical region: Secondary | ICD-10-CM | POA: Diagnosis not present

## 2021-02-19 ENCOUNTER — Other Ambulatory Visit: Payer: Self-pay

## 2021-02-19 ENCOUNTER — Encounter: Payer: Self-pay | Admitting: Orthopedic Surgery

## 2021-02-19 ENCOUNTER — Ambulatory Visit (INDEPENDENT_AMBULATORY_CARE_PROVIDER_SITE_OTHER): Payer: BC Managed Care – PPO | Admitting: Orthopedic Surgery

## 2021-02-19 ENCOUNTER — Ambulatory Visit: Payer: BC Managed Care – PPO

## 2021-02-19 DIAGNOSIS — S92065D Nondisplaced intraarticular fracture of left calcaneus, subsequent encounter for fracture with routine healing: Secondary | ICD-10-CM

## 2021-02-19 DIAGNOSIS — S92064D Nondisplaced intraarticular fracture of right calcaneus, subsequent encounter for fracture with routine healing: Secondary | ICD-10-CM

## 2021-02-19 NOTE — Progress Notes (Signed)
Orthopaedic Clinic Return  Assessment: Jon Campbell is a 60 y.o. male with the following: Comminuted left calcaneus fracture, and stable alignment; continue nonoperative management  Plan: Jon Campbell continues to do well following his injury.  He is tolerated immobilization.  His pain is well controlled.  We will transition into a walking boot today  I have advised him to remain nonweightbearing for an additional 1-2 weeks.  After that, he can gradually increase the amount of weightbearing on the left foot.  I have also provided him with some ankle exercises to initiate as soon as possible, and will return to improve his range of motion and strength of the left foot and ankle.  I have completed paperwork for his work, and short-term disability.  If he has additional paperwork, we will take care of this, and anticipate that he could return to work in as little as 2 months from today.  Follow-up in 4 weeks  Follow-up: Return in about 4 weeks (around 03/19/2021).   Subjective:  Chief Complaint  Patient presents with   Fracture    Lt calcaneus fx DOI 01/11/21    History of Present Illness: Jon Campbell is a 60 y.o. male who returns to clinic for repeat evaluation of his left heel.  He sustained a comminuted fracture of his left calcaneus approximately 5 weeks ago.  He has done well with immobilization.  He has no pain in his left foot.  Upon removal of cast today, he notes some stiffness in his ankle.  No issues to report.  He remains nonweightbearing.  Review of Systems: No fevers or chills No numbness or tingling No chest pain No shortness of breath No bowel or bladder dysfunction No GI distress No headaches  Objective: There were no vitals taken for this visit.  Physical Exam:  Alert and oriented.  No acute distress. Evaluation of left foot demonstrates no swelling.  He has no tenderness to palpation of the heel.  No pain with squeeze testing.  Sensation is intact in the  dorsum of the foot.  Active motion intact with dorsiflexion of the ankle and the great toe.  Toes are warm and well-perfused.  IMAGING: I personally ordered and reviewed the following images:  X-ray of the left foot was obtained in clinic today and demonstrates stable positioning of the comminuted calcaneus fracture.  Well-maintained calcaneal height.  No acute injuries.  Impression: Healing left calcaneus fracture   Oliver Barre, MD 02/19/2021 4:52 PM

## 2021-02-19 NOTE — Patient Instructions (Signed)
Out of work until next visit.  Anticipate full return to work in approximately 2 months    Ankle Exercises Ask your health care provider which exercises are safe for you. Do exercises exactly as told by your health care provider and adjust them as directed. It is normal to feel mild stretching, pulling, tightness, or mild discomfort as you do these exercises. Stop right away if you feel sudden pain or your pain gets worse. Do not begin these exercises until told by your health care provider.  Stretching and range-of-motion exercises These exercises warm up your muscles and joints and improve the movement and flexibility of your ankle. These exercises may also help to relieve pain.  Dorsiflexion/plantar flexion  Sit with your L knee straight or bent. Do not rest your foot on anything. Flex your left ankle to tilt the top of your foot toward your shin. This is called dorsiflexion. Hold this position for 5 seconds. Point your toes downward to tilt the top of your foot away from your shin. This is called plantar flexion. Hold this position for 5 seconds. Repeat 10 times. Complete this exercise 2-3 times a day.  As tolerated  Ankle alphabet  Sit with your L foot supported at your lower leg. Do not rest your foot on anything. Make sure your foot has room to move freely. Think of your L foot as a paintbrush: Move your foot to trace each letter of the alphabet in the air. Keep your hip and knee still while you trace the letters. Trace every letter from A to Z. Make the letters as large as you can without causing or increasing any discomfort.  Repeat 2-3 times. Complete this exercise 2-3 times a day.   Strengthening exercises These exercises build strength and endurance in your ankle. Endurance is the ability to use your muscles for a long time, even after they get tired. Dorsiflexors These are muscles that lift your foot up. Secure a rubber exercise band or tube to an object, such as a  table leg, that will stay still when the band is pulled. Secure the other end around your L foot. Sit on the floor, facing the object with your L leg extended. The band or tube should be slightly tense when your foot is relaxed. Slowly flex your L ankle and toes to bring your foot toward your shin. Hold this position for 5 seconds. Slowly return your foot to the starting position, controlling the band as you do that. Repeat 10 times. Complete this exercise 2-3 times a day.  Plantar flexors These are muscles that push your foot down. Sit on the floor with your L leg extended. Loop a rubber exercise band or tube around the ball of your L foot. The ball of your foot is on the walking surface, right under your toes. The band or tube should be slightly tense when your foot is relaxed. Slowly point your toes downward, pushing them away from you. Hold this position for 5 seconds. Slowly release the tension in the band or tube, controlling smoothly until your foot is back in the starting position. Repeat 10 times. Complete this exercise 2-3 times a day.  Towel curls  Sit in a chair on a non-carpeted surface, and put your feet on the floor. Place a towel in front of your feet. Keeping your heel on the floor, put your L foot on the towel. Pull the towel toward you by grabbing the towel with your toes and curling them under.  Keep your heel on the floor. Let your toes relax. Grab the towel again. Keep pulling the towel until it is completely underneath your foot. Repeat 10 times. Complete this exercise 2-3 times a day.  Standing plantar flexion This is an exercise in which you use your toes to lift your body's weight while standing. Stand with your feet shoulder-width apart. Keep your weight spread evenly over the width of your feet while you rise up on your toes. Use a wall or table to steady yourself if needed, but try not to use it for support. If this exercise is too easy, try these  options: Shift your weight toward your L leg until you feel challenged. If told by your health care provider, lift your uninjured leg off the floor. Hold this position for 5 seconds. Repeat 10 times. Complete this exercise 2-3 times a day.  Tandem walking Stand with one foot directly in front of the other. Slowly raise your back foot up, lifting your heel before your toes, and place it directly in front of your other foot. Continue to walk in this heel-to-toe way. Have a countertop or wall nearby to use if needed to keep your balance, but try not to hold onto anything for support.  Repeat 10 times. Complete this exercise 2-3 times a day.   Document Revised: 03/20/2018 Document Reviewed: 03/22/2018 Elsevier Patient Education  2020 [employer].

## 2021-03-20 ENCOUNTER — Other Ambulatory Visit: Payer: Self-pay

## 2021-03-20 ENCOUNTER — Ambulatory Visit (INDEPENDENT_AMBULATORY_CARE_PROVIDER_SITE_OTHER): Payer: BC Managed Care – PPO | Admitting: Orthopedic Surgery

## 2021-03-20 ENCOUNTER — Encounter: Payer: Self-pay | Admitting: Orthopedic Surgery

## 2021-03-20 ENCOUNTER — Ambulatory Visit: Payer: BC Managed Care – PPO

## 2021-03-20 VITALS — Ht 70.0 in | Wt 159.0 lb

## 2021-03-20 DIAGNOSIS — S92065D Nondisplaced intraarticular fracture of left calcaneus, subsequent encounter for fracture with routine healing: Secondary | ICD-10-CM | POA: Diagnosis not present

## 2021-03-20 DIAGNOSIS — S92064D Nondisplaced intraarticular fracture of right calcaneus, subsequent encounter for fracture with routine healing: Secondary | ICD-10-CM

## 2021-03-20 NOTE — Patient Instructions (Signed)
OK to return to work October 3rd - please provide a letter

## 2021-03-20 NOTE — Progress Notes (Signed)
Orthopaedic Clinic Return  Assessment: Jon Campbell is a 60 y.o. male with the following: Comminuted left calcaneus fracture, and stable alignment; continue nonoperative management  Plan: Mr Bogden is doing well.  Minimal pain.  He is ambulating well with his walking boot.  He can now transition to a regular shoe.  He can anticipate gradual improvement.  He may notice swelling periodically.  He would like to return to work 10/3, so we provided him with a note.  All questions were answered.   Follow-up: Return in about 6 weeks (around 05/01/2021).   Subjective:  Chief Complaint  Patient presents with   Fracture    Lt heel fx DOI 01/11/21    History of Present Illness: Jon Campbell is a 60 y.o. male who returns to clinic for repeat evaluation of his left heel.  Injury was 2 months ago.  He has no pain.  He has had some swelling in the ankle, but this goes away.  He has had some aching laterally, but no additional injuries.  He feels he will be ready to return to work in a couple of weeks.   Review of Systems: No fevers or chills No numbness or tingling No chest pain No shortness of breath No bowel or bladder dysfunction No GI distress No headaches  Objective: Ht 5\' 10"  (1.778 m)   Wt 159 lb (72.1 kg)   BMI 22.81 kg/m   Physical Exam:  Alert and oriented.  No acute distress.  Evaluation of left foot demonstrates no swelling.   He has no tenderness to palpation of the heel.   No pain with squeeze testing.  Sensation is intact in the dorsum of the foot.  Active motion intact with dorsiflexion of the ankle and the great toe.  Toes are warm and well-perfused.  IMAGING: I personally ordered and reviewed the following images:  XR of the left foot demonstrates stable alignment of a comminuted calcaneus fracture.  There has been no interval displacement.  Well maintained calcaneal height.  No acute injuries are noted.   Impression: healing left calcaneus fracture.     , MD 03/20/2021 3:10 PM

## 2021-03-21 DIAGNOSIS — M5030 Other cervical disc degeneration, unspecified cervical region: Secondary | ICD-10-CM | POA: Diagnosis not present

## 2021-03-21 DIAGNOSIS — G894 Chronic pain syndrome: Secondary | ICD-10-CM | POA: Diagnosis not present

## 2021-04-18 DIAGNOSIS — F419 Anxiety disorder, unspecified: Secondary | ICD-10-CM | POA: Diagnosis not present

## 2021-04-18 DIAGNOSIS — Z23 Encounter for immunization: Secondary | ICD-10-CM | POA: Diagnosis not present

## 2021-04-18 DIAGNOSIS — G894 Chronic pain syndrome: Secondary | ICD-10-CM | POA: Diagnosis not present

## 2021-04-18 DIAGNOSIS — M5030 Other cervical disc degeneration, unspecified cervical region: Secondary | ICD-10-CM | POA: Diagnosis not present

## 2021-04-18 DIAGNOSIS — Z6824 Body mass index (BMI) 24.0-24.9, adult: Secondary | ICD-10-CM | POA: Diagnosis not present

## 2021-04-29 ENCOUNTER — Ambulatory Visit (INDEPENDENT_AMBULATORY_CARE_PROVIDER_SITE_OTHER): Payer: BC Managed Care – PPO | Admitting: Orthopedic Surgery

## 2021-04-29 ENCOUNTER — Other Ambulatory Visit: Payer: Self-pay

## 2021-04-29 ENCOUNTER — Encounter: Payer: Self-pay | Admitting: Orthopedic Surgery

## 2021-04-29 ENCOUNTER — Ambulatory Visit: Payer: BC Managed Care – PPO

## 2021-04-29 DIAGNOSIS — S92065D Nondisplaced intraarticular fracture of left calcaneus, subsequent encounter for fracture with routine healing: Secondary | ICD-10-CM | POA: Diagnosis not present

## 2021-04-29 DIAGNOSIS — S92064D Nondisplaced intraarticular fracture of right calcaneus, subsequent encounter for fracture with routine healing: Secondary | ICD-10-CM

## 2021-04-29 NOTE — Progress Notes (Signed)
Orthopaedic Clinic Return  Assessment: Jon Campbell is a 60 y.o. male with the following: Comminuted left calcaneus fracture, in stable alignment; nonoperative management  Plan: Overall, he is recovering well.  He has been wearing a regular shoe for the past month.  He does note some aching pain in his left foot.  No tenderness to palpation.  X-rays remained stable.  He is taking occasional ibuprofen for pain.  He notes some swelling at the end of the day, but this improves when he gets his feet.  At this point, he will continue to gradually get better.  No need for continued follow-up.  If he has any issues, I have asked him to contact the clinic.  He will have gradual improvement and occasional for 9 to 12 months following the injury.   Follow-up: Return if symptoms worsen or fail to improve.   Subjective:  Chief Complaint  Patient presents with   fx care    Left// follow up w xrays    History of Present Illness: Jon Campbell is a 60 y.o. male who returns to clinic for repeat evaluation of his left heel.  He sustained an injury in early July, which is over 3 months ago.  He has been using a regular shoe for the past month.  He returned to work 3 weeks ago.  He notes occasional swelling, which improves once he gets off his feet.  He is taking occasional Goody powders for headaches and similar pains, and occasionally taking ibuprofen.  He has some aching pains throughout the foot, but no pain in his left heel.  There are no areas of tenderness to palpation.  Review of Systems: No fevers or chills No numbness or tingling No chest pain No shortness of breath No bowel or bladder dysfunction No GI distress No headaches  Objective: There were no vitals taken for this visit.  Physical Exam:  Alert and oriented.  No acute distress.  Mild left-sided antalgic gait.  Evaluation of left foot demonstrates no swelling.   He has no tenderness to palpation of the heel.   No pain  with squeeze testing.  Sensation is intact in the dorsum of the foot.  Active motion intact with dorsiflexion of the ankle and the great toe.  Toes are warm and well-perfused.  15 degrees of dorsiflexion with the knee extended, and 20 degrees of dorsiflexion with the knee bent.  IMAGING: I personally ordered and reviewed the following images:  X-rays of the left foot were obtained today, and compared to previous x-rays.  There has been interval consolidation of the comminuted calcaneus fracture.  Overall alignment remains good.  Boehler's angle is within normal limits.  No acute injuries are noted.  Calcaneal height is essentially normal.  Impression: Healed left calcaneus fracture  Oliver Barre, MD 04/29/2021 3:43 PM

## 2021-05-20 DIAGNOSIS — M5030 Other cervical disc degeneration, unspecified cervical region: Secondary | ICD-10-CM | POA: Diagnosis not present

## 2021-05-20 DIAGNOSIS — G894 Chronic pain syndrome: Secondary | ICD-10-CM | POA: Diagnosis not present

## 2021-06-13 DIAGNOSIS — G894 Chronic pain syndrome: Secondary | ICD-10-CM | POA: Diagnosis not present

## 2021-06-13 DIAGNOSIS — M5030 Other cervical disc degeneration, unspecified cervical region: Secondary | ICD-10-CM | POA: Diagnosis not present

## 2021-07-15 DIAGNOSIS — G894 Chronic pain syndrome: Secondary | ICD-10-CM | POA: Diagnosis not present

## 2021-07-15 DIAGNOSIS — M5030 Other cervical disc degeneration, unspecified cervical region: Secondary | ICD-10-CM | POA: Diagnosis not present

## 2021-07-15 DIAGNOSIS — E663 Overweight: Secondary | ICD-10-CM | POA: Diagnosis not present

## 2021-07-15 DIAGNOSIS — F419 Anxiety disorder, unspecified: Secondary | ICD-10-CM | POA: Diagnosis not present

## 2021-07-15 DIAGNOSIS — Z6824 Body mass index (BMI) 24.0-24.9, adult: Secondary | ICD-10-CM | POA: Diagnosis not present

## 2021-08-15 DIAGNOSIS — M25511 Pain in right shoulder: Secondary | ICD-10-CM | POA: Diagnosis not present

## 2021-08-15 DIAGNOSIS — M5030 Other cervical disc degeneration, unspecified cervical region: Secondary | ICD-10-CM | POA: Diagnosis not present

## 2021-08-15 DIAGNOSIS — G894 Chronic pain syndrome: Secondary | ICD-10-CM | POA: Diagnosis not present

## 2021-08-20 DIAGNOSIS — M25511 Pain in right shoulder: Secondary | ICD-10-CM | POA: Insufficient documentation

## 2021-08-22 DIAGNOSIS — M25511 Pain in right shoulder: Secondary | ICD-10-CM | POA: Diagnosis not present

## 2021-09-11 DIAGNOSIS — M5030 Other cervical disc degeneration, unspecified cervical region: Secondary | ICD-10-CM | POA: Diagnosis not present

## 2021-09-11 DIAGNOSIS — G894 Chronic pain syndrome: Secondary | ICD-10-CM | POA: Diagnosis not present

## 2021-10-01 DIAGNOSIS — M25511 Pain in right shoulder: Secondary | ICD-10-CM | POA: Diagnosis not present

## 2021-10-01 DIAGNOSIS — M5030 Other cervical disc degeneration, unspecified cervical region: Secondary | ICD-10-CM | POA: Diagnosis not present

## 2021-10-01 DIAGNOSIS — G894 Chronic pain syndrome: Secondary | ICD-10-CM | POA: Diagnosis not present

## 2021-10-01 DIAGNOSIS — Z6824 Body mass index (BMI) 24.0-24.9, adult: Secondary | ICD-10-CM | POA: Diagnosis not present

## 2021-10-25 DIAGNOSIS — G894 Chronic pain syndrome: Secondary | ICD-10-CM | POA: Diagnosis not present

## 2021-10-25 DIAGNOSIS — M5030 Other cervical disc degeneration, unspecified cervical region: Secondary | ICD-10-CM | POA: Diagnosis not present

## 2021-11-11 DIAGNOSIS — Z681 Body mass index (BMI) 19 or less, adult: Secondary | ICD-10-CM | POA: Diagnosis not present

## 2021-11-11 DIAGNOSIS — G894 Chronic pain syndrome: Secondary | ICD-10-CM | POA: Diagnosis not present

## 2021-11-11 DIAGNOSIS — M5030 Other cervical disc degeneration, unspecified cervical region: Secondary | ICD-10-CM | POA: Diagnosis not present

## 2021-11-14 DIAGNOSIS — M5030 Other cervical disc degeneration, unspecified cervical region: Secondary | ICD-10-CM | POA: Diagnosis not present

## 2021-11-14 DIAGNOSIS — G894 Chronic pain syndrome: Secondary | ICD-10-CM | POA: Diagnosis not present

## 2021-12-09 DIAGNOSIS — G894 Chronic pain syndrome: Secondary | ICD-10-CM | POA: Diagnosis not present

## 2021-12-09 DIAGNOSIS — M5030 Other cervical disc degeneration, unspecified cervical region: Secondary | ICD-10-CM | POA: Diagnosis not present

## 2022-01-29 DIAGNOSIS — M5030 Other cervical disc degeneration, unspecified cervical region: Secondary | ICD-10-CM | POA: Diagnosis not present

## 2022-01-29 DIAGNOSIS — Z6822 Body mass index (BMI) 22.0-22.9, adult: Secondary | ICD-10-CM | POA: Diagnosis not present

## 2022-01-29 DIAGNOSIS — G894 Chronic pain syndrome: Secondary | ICD-10-CM | POA: Diagnosis not present

## 2022-03-04 DIAGNOSIS — G894 Chronic pain syndrome: Secondary | ICD-10-CM | POA: Diagnosis not present

## 2022-03-04 DIAGNOSIS — M5030 Other cervical disc degeneration, unspecified cervical region: Secondary | ICD-10-CM | POA: Diagnosis not present

## 2022-03-04 DIAGNOSIS — F419 Anxiety disorder, unspecified: Secondary | ICD-10-CM | POA: Diagnosis not present

## 2022-04-25 DIAGNOSIS — G894 Chronic pain syndrome: Secondary | ICD-10-CM | POA: Diagnosis not present

## 2022-04-25 DIAGNOSIS — M5030 Other cervical disc degeneration, unspecified cervical region: Secondary | ICD-10-CM | POA: Diagnosis not present

## 2022-04-25 DIAGNOSIS — F419 Anxiety disorder, unspecified: Secondary | ICD-10-CM | POA: Diagnosis not present

## 2022-06-03 DIAGNOSIS — Z23 Encounter for immunization: Secondary | ICD-10-CM | POA: Diagnosis not present

## 2022-06-03 DIAGNOSIS — M5030 Other cervical disc degeneration, unspecified cervical region: Secondary | ICD-10-CM | POA: Diagnosis not present

## 2022-06-03 DIAGNOSIS — Z6824 Body mass index (BMI) 24.0-24.9, adult: Secondary | ICD-10-CM | POA: Diagnosis not present

## 2022-06-03 DIAGNOSIS — G894 Chronic pain syndrome: Secondary | ICD-10-CM | POA: Diagnosis not present

## 2022-07-29 DIAGNOSIS — M5030 Other cervical disc degeneration, unspecified cervical region: Secondary | ICD-10-CM | POA: Diagnosis not present

## 2022-07-29 DIAGNOSIS — M25511 Pain in right shoulder: Secondary | ICD-10-CM | POA: Diagnosis not present

## 2022-07-29 DIAGNOSIS — G894 Chronic pain syndrome: Secondary | ICD-10-CM | POA: Diagnosis not present

## 2022-08-21 DIAGNOSIS — M5030 Other cervical disc degeneration, unspecified cervical region: Secondary | ICD-10-CM | POA: Diagnosis not present

## 2022-08-21 DIAGNOSIS — G894 Chronic pain syndrome: Secondary | ICD-10-CM | POA: Diagnosis not present

## 2022-08-21 DIAGNOSIS — M75101 Unspecified rotator cuff tear or rupture of right shoulder, not specified as traumatic: Secondary | ICD-10-CM | POA: Diagnosis not present

## 2022-09-04 ENCOUNTER — Encounter: Payer: Self-pay | Admitting: Radiology

## 2022-09-16 ENCOUNTER — Encounter: Payer: Self-pay | Admitting: Orthopedic Surgery

## 2022-09-16 ENCOUNTER — Ambulatory Visit (INDEPENDENT_AMBULATORY_CARE_PROVIDER_SITE_OTHER): Payer: BC Managed Care – PPO

## 2022-09-16 ENCOUNTER — Ambulatory Visit: Payer: BC Managed Care – PPO | Admitting: Orthopedic Surgery

## 2022-09-16 VITALS — BP 141/78 | HR 80 | Ht 70.0 in | Wt 154.0 lb

## 2022-09-16 DIAGNOSIS — G8929 Other chronic pain: Secondary | ICD-10-CM | POA: Diagnosis not present

## 2022-09-16 DIAGNOSIS — M75101 Unspecified rotator cuff tear or rupture of right shoulder, not specified as traumatic: Secondary | ICD-10-CM | POA: Diagnosis not present

## 2022-09-16 DIAGNOSIS — Z6822 Body mass index (BMI) 22.0-22.9, adult: Secondary | ICD-10-CM | POA: Diagnosis not present

## 2022-09-16 DIAGNOSIS — M5030 Other cervical disc degeneration, unspecified cervical region: Secondary | ICD-10-CM | POA: Diagnosis not present

## 2022-09-16 DIAGNOSIS — M25511 Pain in right shoulder: Secondary | ICD-10-CM

## 2022-09-16 DIAGNOSIS — G894 Chronic pain syndrome: Secondary | ICD-10-CM | POA: Diagnosis not present

## 2022-09-16 NOTE — Patient Instructions (Addendum)
Referral for physical therapy  6 weeks of therapy  Follow up in 2 months for repeat evaluation  Physical therapy has been ordered for you at [facility] They should call you to schedule, [phone]  is the phone number to call, if you want to call to schedule.

## 2022-09-17 NOTE — Progress Notes (Signed)
Orthopaedic Clinic Return  Assessment: Jon Campbell is a 62 y.o. male with the following: Right shoulder pain, limited function  Plan: Mr. Petit has a right shoulder pain.  Radiographs demonstrates some mild proximal humeral migration.  He has restricted range of motion, and function.  He notes pain.  I am concerned about a chronic rotator cuff injury.  He has previously had injections, with limited improvement.  At this point, would like to proceed with physical therapy.  He is in agreement with this plan.  He has limited improvement following therapy, we will likely proceed with an MRI.  Follow-up: Return in about 2 months (around 11/16/2022).   Subjective:  Chief Complaint  Patient presents with   Shoulder Pain    Right     History of Present Illness: Jon Campbell is a 62 y.o. male who returns to clinic for evaluation of right shoulder pain.  He has had pain in the right shoulder for over a year.  He has been evaluated elsewhere.  He has had an injection, with limited sustained improvement.  No history of prior injury.  No surgeries.  He has not worked with physical therapy.  Occasional medications.  Review of Systems: No fevers or chills No numbness or tingling No chest pain No shortness of breath No bowel or bladder dysfunction No GI distress No headaches   Objective: BP (!) 141/78   Pulse 80   Ht '5\' 10"'$  (1.778 m)   Wt 154 lb (69.9 kg)   BMI 22.10 kg/m   Physical Exam:  Alert and oriented.  No acute distress.  Right shoulder without deformity.  No atrophy.  No bruising.  No swelling.  Limited forward flexion.  Internal rotation to lumbar spine.  Fingers are warm and well-perfused.  Pain with strength testing.  4/5 rotator cuff strength.  Negative belly press.  IMAGING: I personally ordered and reviewed the following images:  X-rays of the right shoulder were obtained in clinic today.  No acute injuries are noted.  There is evidence of proximal humeral  migration.  Minimal degenerative changes.  Minimal degenerative changes in the [facility] joint.  No bony lesions.  Impression: Right shoulder x-ray, with mild proximal humeral migration   Mordecai Rasmussen, MD 09/17/2022 10:14 AM

## 2022-09-23 DIAGNOSIS — I10 Essential (primary) hypertension: Secondary | ICD-10-CM | POA: Diagnosis not present

## 2022-09-25 ENCOUNTER — Other Ambulatory Visit: Payer: Self-pay

## 2022-09-25 DIAGNOSIS — G8929 Other chronic pain: Secondary | ICD-10-CM

## 2022-10-23 DIAGNOSIS — Z6823 Body mass index (BMI) 23.0-23.9, adult: Secondary | ICD-10-CM | POA: Diagnosis not present

## 2022-10-23 DIAGNOSIS — G894 Chronic pain syndrome: Secondary | ICD-10-CM | POA: Diagnosis not present

## 2022-11-13 DIAGNOSIS — M75101 Unspecified rotator cuff tear or rupture of right shoulder, not specified as traumatic: Secondary | ICD-10-CM | POA: Diagnosis not present

## 2022-11-13 DIAGNOSIS — G894 Chronic pain syndrome: Secondary | ICD-10-CM | POA: Diagnosis not present

## 2022-11-13 DIAGNOSIS — M5030 Other cervical disc degeneration, unspecified cervical region: Secondary | ICD-10-CM | POA: Diagnosis not present

## 2022-11-18 ENCOUNTER — Ambulatory Visit: Payer: BC Managed Care – PPO | Admitting: Orthopedic Surgery

## 2023-01-06 DIAGNOSIS — M75101 Unspecified rotator cuff tear or rupture of right shoulder, not specified as traumatic: Secondary | ICD-10-CM | POA: Diagnosis not present

## 2023-01-06 DIAGNOSIS — M5030 Other cervical disc degeneration, unspecified cervical region: Secondary | ICD-10-CM | POA: Diagnosis not present

## 2023-01-06 DIAGNOSIS — I10 Essential (primary) hypertension: Secondary | ICD-10-CM | POA: Diagnosis not present

## 2023-01-06 DIAGNOSIS — G894 Chronic pain syndrome: Secondary | ICD-10-CM | POA: Diagnosis not present

## 2024-04-30 ENCOUNTER — Encounter (HOSPITAL_COMMUNITY): Payer: Self-pay | Admitting: *Deleted

## 2024-04-30 ENCOUNTER — Emergency Department (HOSPITAL_COMMUNITY): Admission: EM | Admit: 2024-04-30 | Discharge: 2024-04-30 | Disposition: A | Discharge status: Home / Self Care

## 2024-04-30 ENCOUNTER — Other Ambulatory Visit: Payer: Self-pay

## 2024-04-30 DIAGNOSIS — F1193 Opioid use, unspecified with withdrawal: Secondary | ICD-10-CM

## 2024-04-30 DIAGNOSIS — M7918 Myalgia, other site: Secondary | ICD-10-CM | POA: Diagnosis not present

## 2024-04-30 DIAGNOSIS — M542 Cervicalgia: Secondary | ICD-10-CM | POA: Insufficient documentation

## 2024-04-30 DIAGNOSIS — R197 Diarrhea, unspecified: Secondary | ICD-10-CM | POA: Diagnosis not present

## 2024-04-30 DIAGNOSIS — F1123 Opioid dependence with withdrawal: Secondary | ICD-10-CM | POA: Insufficient documentation

## 2024-04-30 DIAGNOSIS — R000 Tachycardia, unspecified: Secondary | ICD-10-CM | POA: Diagnosis not present

## 2024-04-30 DIAGNOSIS — R110 Nausea: Secondary | ICD-10-CM | POA: Insufficient documentation

## 2024-04-30 LAB — CBC WITH DIFFERENTIAL/PLATELET
Abs Immature Granulocytes: 0.05 K/uL (ref 0.00–0.07)
Basophils Absolute: 0.1 K/uL (ref 0.0–0.1)
Basophils Relative: 0 %
Eosinophils Absolute: 0.1 K/uL (ref 0.0–0.5)
Eosinophils Relative: 1 %
HCT: 51.1 % (ref 39.0–52.0)
Hemoglobin: 17.9 g/dL — ABNORMAL HIGH (ref 13.0–17.0)
Immature Granulocytes: 0 %
Lymphocytes Relative: 21 %
Lymphs Abs: 3 K/uL (ref 0.7–4.0)
MCH: 31.3 pg (ref 26.0–34.0)
MCHC: 35 g/dL (ref 30.0–36.0)
MCV: 89.3 fL (ref 80.0–100.0)
Monocytes Absolute: 0.9 K/uL (ref 0.1–1.0)
Monocytes Relative: 6 %
Neutro Abs: 10.5 K/uL — ABNORMAL HIGH (ref 1.7–7.7)
Neutrophils Relative %: 72 %
Platelets: 315 K/uL (ref 150–400)
RBC: 5.72 MIL/uL (ref 4.22–5.81)
RDW: 13 % (ref 11.5–15.5)
WBC: 14.6 K/uL — ABNORMAL HIGH (ref 4.0–10.5)
nRBC: 0 % (ref 0.0–0.2)

## 2024-04-30 LAB — BASIC METABOLIC PANEL WITH GFR
Anion gap: 14 (ref 5–15)
BUN: 19 mg/dL (ref 8–23)
CO2: 26 mmol/L (ref 22–32)
Calcium: 9.1 mg/dL (ref 8.9–10.3)
Chloride: 92 mmol/L — ABNORMAL LOW (ref 98–111)
Creatinine, Ser: 0.78 mg/dL (ref 0.61–1.24)
GFR, Estimated: >60 mL/min (ref >60)
Glucose, Bld: 125 mg/dL — ABNORMAL HIGH (ref 70–99)
Potassium: 3.7 mmol/L (ref 3.5–5.1)
Sodium: 132 mmol/L — ABNORMAL LOW (ref 135–145)

## 2024-04-30 MED ORDER — OXYCODONE HCL 5 MG PO TABS
30.0000 mg | ORAL_TABLET | Freq: Once | ORAL | Status: AC
  Administered 2024-04-30: 30 mg via ORAL
  Filled 2024-04-30: qty 6

## 2024-04-30 MED ORDER — SODIUM CHLORIDE 0.9 % IV BOLUS
1000.0000 mL | Freq: Once | INTRAVENOUS | Status: AC
  Administered 2024-04-30: 1000 mL via INTRAVENOUS

## 2024-04-30 MED ORDER — OXYCODONE HCL 30 MG PO TABS: 30.0000 mg | ORAL_TABLET | Freq: Four times a day (QID) | ORAL | 0 refills | Status: AC | PRN

## 2024-04-30 NOTE — ED Notes (Signed)
 Pt/family received d/c paperwork at this time. After going over the paperwork any questions, comments, or concerns were answered to the best of this nurse's knowledge. The pt/family verbally acknowledged the teachings/instructions.

## 2024-04-30 NOTE — Discharge Instructions (Addendum)
 You are seen today for opiate withdrawal as you are out of your chronic pain medicine.  I am able to prescribe 3 days until you can get into your new primary care doctor but we cannot prescribe this long-term.  Your labs showed an elevated white blood cell count but you do not have any symptoms of infection, your vitals are normal after pain medicine and fluids.  Please follow-up with your PCP and come into the ER if you have new or worsening symptoms, though we we cannot prescribe further opiates for chronic pain.  You were prescribed opiate pain medication. This can have many sided effect such as drowsiness, slowed breathing, and conspitation. Do not take it with other medications that cause drowsiness, or drink alcohol while taking it. Take a stool softener over the counter while taking it.

## 2024-04-30 NOTE — ED Triage Notes (Signed)
 Pt with emesis and diarrhea, nausea as well. Pt is having withdrawals, pt states he has run out of his pain medication oxycodone , doctor recently retired. Pt states he ran out Monday but found some in a different location and took yesterday. Pt has doctor appt scheduled on Tuesday at [facility].

## 2024-04-30 NOTE — ED Provider Notes (Signed)
 [place] EMERGENCY DEPARTMENT AT [facility] Provider Note   CSN: [id number] Arrival date & time: 04/30/24  1532     Patient presents with: Withdrawal   Jon Campbell is a 63 y.o. male.  He does ER today complaining of nausea diarrhea body aches and stomach cramping for the past several days.  He states this started when he had run out of his chronic pain medicine.  States his PCP had retired, has been out of pain medicine for several days.  He states he found 4 tablets leftover yesterday so he took these and they helped his symptoms somewhat so he is no longer having diarrhea and the pain is somewhat less but he still has his chronic neck pain.  He is appointment with [facility] to reestablish care with new PCP in 3 days.  No chest pain or shortness of breath, no fevers, no urinary symptoms.   HPI     Prior to Admission medications   Medication Sig Start Date End Date Taking? Authorizing Provider  celecoxib (CELEBREX) 200 MG capsule Take 200 mg by mouth daily. Patient not taking: Reported on 09/16/2022 12/25/20   [provider]  HYDROmorphone  (DILAUDID ) 4 MG tablet Take 4 mg by mouth every 4 (four) hours as needed. Patient not taking: Reported on 09/16/2022 12/27/20   [provider]  morphine  (MS CONTIN ) 15 MG 12 hr tablet Take 15 mg by mouth in the morning and at bedtime. 01/05/21   [provider]  Oxycodone  HCl 20 MG TABS Take 1 tablet by mouth every 4 (four) hours. 08/27/22   [provider]    Allergies: Patient has no known allergies.    Review of Systems  Updated Vital Signs BP (!) 119/96 (BP Location: Right Arm)   Pulse (!) 145   Temp 98.4 F (36.9 C) (Oral)   Resp 19   Ht 5' 10 (1.778 m)   Wt 72.6 kg   SpO2 95%   BMI 22.96 kg/m   Physical Exam Vitals and nursing note reviewed.  Constitutional:      General: He is not in acute distress.    Appearance: He is well-developed.  HENT:     Head:  Normocephalic and atraumatic.     Mouth/Throat:     Mouth: Mucous membranes are moist.  Eyes:     Extraocular Movements: Extraocular movements intact.     Conjunctiva/sclera: Conjunctivae normal.     Pupils: Pupils are equal, round, and reactive to light.  Cardiovascular:     Rate and Rhythm: Regular rhythm. Tachycardia present.     Heart sounds: No murmur heard.    Comments: HR 105 Pulmonary:     Effort: Pulmonary effort is normal. No respiratory distress.     Breath sounds: Normal breath sounds.  Abdominal:     Palpations: Abdomen is soft.     Tenderness: There is no abdominal tenderness.  Musculoskeletal:        General: No swelling.     Cervical back: Neck supple.  Skin:    General: Skin is warm and dry.     Capillary Refill: Capillary refill takes less than 2 seconds.  Neurological:     General: No focal deficit present.     Mental Status: He is alert and oriented to person, place, and time.  Psychiatric:        Mood and Affect: Mood normal.     (all labs ordered are listed, but only abnormal results are displayed)  Labs Reviewed  CBC WITH DIFFERENTIAL/PLATELET  BASIC METABOLIC PANEL WITH GFR    EKG: None  Radiology: No results found.   Procedures   Medications Ordered in the ED  oxyCODONE  (Oxy IR/ROXICODONE ) immediate release tablet 30 mg (has no administration in time range)  sodium chloride  0.9 % bolus 1,000 mL (has no administration in time range)                                    Medical Decision Making Ddx: opiate withdrawal, viral syndrome, other  ED course: Patient presents to ER for nausea, body aches, chills, diarrhea over the past 4 days.  It got somewhat better yesterday after he had found several tablets of oxycodone  that he had set aside somewhere.  But he is out again today and starting to have the nausea body aches again.  Denies fever, chest pain, shortness of breath.  In the ER he was given a dose of his home oxycodone  30 mg which was  confirmed with his PDMP review and given IV fluids, his vitals have normalized and he is feeling much better, EKG reassuring, labs showed mild hyponatremia at 132, patient given fluids and leukocytosis of 14.6 with no signs of infection and patient has no symptoms of infection, he has no cough or shortness of breath suggestive of pneumonia, no urinary symptoms, feel this may be reactive from his vomiting and diarrhea over the past several days.  He was informed of his findings and given 3-day supply of pain medicine to get him through until he can see his PCP on Tuesday.  Discussed that would not give him further medications here, since he takes it for chronic neck pain and plans to continue taking it I did not feel Suboxone would be the best choice for him.  While I was hesitant to prescribe chronic pain medicine, feel this case it is reasonable, his doctor retired, he already has established follow-up but has a small gap, he tried to make it through without the medications, but was simply unable to.  He does not visit the ER frequently and his PDMP shows no aberrancy in prescribers or medications.  Amount and/or Complexity of Data Reviewed External Data Reviewed: labs and notes. Labs: ordered.  Risk Prescription drug management.        Final diagnoses:  None    ED Discharge Orders     None          Jon Campbell 04/30/24 1841    Jon Lavonia SAILOR, MD 04/30/24 2248
